# Patient Record
Sex: Male | Born: 1955 | Race: Black or African American | Hispanic: No | Marital: Married | Smoking: Never smoker
Health system: Southern US, Community
[De-identification: ages and names within clinical notes are randomized; demographics above are authoritative.]

## PROBLEM LIST (undated history)

## (undated) DIAGNOSIS — K219 Gastro-esophageal reflux disease without esophagitis: Secondary | ICD-10-CM

## (undated) DIAGNOSIS — E785 Hyperlipidemia, unspecified: Secondary | ICD-10-CM

## (undated) DIAGNOSIS — G4730 Sleep apnea, unspecified: Secondary | ICD-10-CM

## (undated) DIAGNOSIS — I10 Essential (primary) hypertension: Secondary | ICD-10-CM

## (undated) DIAGNOSIS — J45909 Unspecified asthma, uncomplicated: Secondary | ICD-10-CM

## (undated) DIAGNOSIS — M1990 Unspecified osteoarthritis, unspecified site: Secondary | ICD-10-CM

## (undated) DIAGNOSIS — E119 Type 2 diabetes mellitus without complications: Secondary | ICD-10-CM

## (undated) HISTORY — PX: HERNIA REPAIR: SHX51

## (undated) HISTORY — PX: WRIST FRACTURE SURGERY: SHX121

---

## 1999-10-22 ENCOUNTER — Emergency Department (HOSPITAL_COMMUNITY): Admission: EM | Admit: 1999-10-22 | Discharge: 1999-10-22 | Payer: Self-pay | Admitting: Emergency Medicine

## 2000-11-16 ENCOUNTER — Emergency Department (HOSPITAL_COMMUNITY): Admission: EM | Admit: 2000-11-16 | Discharge: 2000-11-16 | Payer: Self-pay | Admitting: Emergency Medicine

## 2001-03-19 ENCOUNTER — Emergency Department (HOSPITAL_COMMUNITY): Admission: EM | Admit: 2001-03-19 | Discharge: 2001-03-19 | Payer: Self-pay | Admitting: Emergency Medicine

## 2002-01-19 ENCOUNTER — Emergency Department (HOSPITAL_COMMUNITY): Admission: EM | Admit: 2002-01-19 | Discharge: 2002-01-19 | Payer: Self-pay

## 2002-06-10 ENCOUNTER — Emergency Department (HOSPITAL_COMMUNITY): Admission: EM | Admit: 2002-06-10 | Discharge: 2002-06-10 | Payer: Self-pay

## 2002-07-20 ENCOUNTER — Emergency Department (HOSPITAL_COMMUNITY): Admission: EM | Admit: 2002-07-20 | Discharge: 2002-07-20 | Payer: Self-pay | Admitting: Emergency Medicine

## 2002-07-20 ENCOUNTER — Encounter: Payer: Self-pay | Admitting: Emergency Medicine

## 2004-01-19 ENCOUNTER — Ambulatory Visit (HOSPITAL_COMMUNITY): Admission: RE | Admit: 2004-01-19 | Discharge: 2004-01-19 | Payer: Self-pay | Admitting: General Surgery

## 2005-04-28 ENCOUNTER — Ambulatory Visit: Payer: Self-pay | Admitting: Family Medicine

## 2005-07-04 ENCOUNTER — Ambulatory Visit: Payer: Self-pay | Admitting: Family Medicine

## 2005-08-28 ENCOUNTER — Encounter: Admission: RE | Admit: 2005-08-28 | Discharge: 2005-11-26 | Payer: Self-pay | Admitting: Family Medicine

## 2005-10-22 ENCOUNTER — Emergency Department (HOSPITAL_COMMUNITY): Admission: EM | Admit: 2005-10-22 | Discharge: 2005-10-22 | Payer: Self-pay | Admitting: Emergency Medicine

## 2005-11-27 ENCOUNTER — Encounter: Admission: RE | Admit: 2005-11-27 | Discharge: 2005-12-17 | Payer: Self-pay | Admitting: Family Medicine

## 2005-12-19 ENCOUNTER — Emergency Department (HOSPITAL_COMMUNITY): Admission: EM | Admit: 2005-12-19 | Discharge: 2005-12-19 | Payer: Self-pay | Admitting: Emergency Medicine

## 2006-01-02 ENCOUNTER — Ambulatory Visit: Payer: Self-pay | Admitting: Family Medicine

## 2006-08-05 ENCOUNTER — Ambulatory Visit (HOSPITAL_COMMUNITY): Admission: RE | Admit: 2006-08-05 | Discharge: 2006-08-05 | Payer: Self-pay | Admitting: Internal Medicine

## 2011-01-04 ENCOUNTER — Encounter: Payer: Self-pay | Admitting: Family Medicine

## 2014-01-05 ENCOUNTER — Emergency Department (HOSPITAL_COMMUNITY)
Admission: EM | Admit: 2014-01-05 | Discharge: 2014-01-05 | Disposition: A | Discharge status: Home / Self Care | Payer: 59 | Attending: Emergency Medicine | Admitting: Emergency Medicine

## 2014-01-05 ENCOUNTER — Encounter (HOSPITAL_COMMUNITY): Payer: Self-pay | Admitting: Emergency Medicine

## 2014-01-05 ENCOUNTER — Emergency Department (HOSPITAL_COMMUNITY): Payer: 59

## 2014-01-05 DIAGNOSIS — R109 Unspecified abdominal pain: Secondary | ICD-10-CM | POA: Insufficient documentation

## 2014-01-05 DIAGNOSIS — Z79899 Other long term (current) drug therapy: Secondary | ICD-10-CM | POA: Insufficient documentation

## 2014-01-05 DIAGNOSIS — Z7982 Long term (current) use of aspirin: Secondary | ICD-10-CM | POA: Insufficient documentation

## 2014-01-05 DIAGNOSIS — E119 Type 2 diabetes mellitus without complications: Secondary | ICD-10-CM | POA: Insufficient documentation

## 2014-01-05 HISTORY — DX: Type 2 diabetes mellitus without complications: E11.9

## 2014-01-05 LAB — URINALYSIS, ROUTINE W REFLEX MICROSCOPIC
Bilirubin Urine: NEGATIVE
Glucose, UA: NEGATIVE mg/dL
Hgb urine dipstick: NEGATIVE
KETONES UR: NEGATIVE mg/dL
Leukocytes, UA: NEGATIVE
NITRITE: NEGATIVE
Protein, ur: NEGATIVE mg/dL
Specific Gravity, Urine: 1.02 (ref 1.005–1.030)
Urobilinogen, UA: 0.2 mg/dL (ref 0.0–1.0)
pH: 6 (ref 5.0–8.0)

## 2014-01-05 MED ORDER — MORPHINE SULFATE 4 MG/ML IJ SOLN
4.0000 mg | Freq: Once | INTRAMUSCULAR | Status: AC
  Administered 2014-01-05: 4 mg via INTRAVENOUS
  Filled 2014-01-05: qty 1

## 2014-01-05 MED ORDER — KETOROLAC TROMETHAMINE 30 MG/ML IJ SOLN
30.0000 mg | Freq: Once | INTRAMUSCULAR | Status: AC
  Administered 2014-01-05: 30 mg via INTRAVENOUS
  Filled 2014-01-05: qty 1

## 2014-01-05 MED ORDER — PROMETHAZINE HCL 25 MG PO TABS: 25.0000 mg | ORAL_TABLET | Freq: Four times a day (QID) | ORAL | Status: DC | PRN

## 2014-01-05 MED ORDER — SODIUM CHLORIDE 0.9 % IV BOLUS (SEPSIS)
500.0000 mL | Freq: Once | INTRAVENOUS | Status: AC
  Administered 2014-01-05: 500 mL via INTRAVENOUS

## 2014-01-05 MED ORDER — OXYCODONE-ACETAMINOPHEN 5-325 MG PO TABS: 1.0000 | ORAL_TABLET | ORAL | Status: DC | PRN

## 2014-01-05 NOTE — ED Notes (Signed)
Pt c/o pain in left flank x 2 weeks.  Reports 2 weeks ago had some hematuria and burning with urination.  Denies n/v/d.

## 2014-01-05 NOTE — ED Provider Notes (Signed)
CSN: [id number]     Arrival date & time 01/05/14  1441 History   First MD Initiated Contact with Patient 01/05/14 1453     Chief Complaint  Patient presents with  . Flank Pain   (Consider location/radiation/quality/duration/timing/severity/associated sxs/prior Treatment) HPI.... Sharp left flank pain for 2 weeks with minimal hematuria. No hematuria now. Questionable dysuria. No fever or chills. No prior history of kidney stones. No radiation of pain. Nothing makes symptoms better or worse. Severity is moderate.  Past Medical History  Diagnosis Date  . Diabetes mellitus without complication    History reviewed. No pertinent past surgical history. No family history on file. History  Substance Use Topics  . Smoking status: Never Smoker   . Smokeless tobacco: Not on file  . Alcohol Use: No    Review of Systems  All other systems reviewed and are negative.    Allergies  Review of patient's allergies indicates no known allergies.  Home Medications   Current Outpatient Rx  Name  Route  Sig  Dispense  Refill  . aspirin EC 81 MG tablet   Oral   Take 81 mg by mouth daily.         . metFORMIN (GLUCOPHAGE) 500 MG tablet   Oral   Take 500 mg by mouth 3 (three) times daily after meals.         Marland Kitchen oxyCODONE-acetaminophen (PERCOCET) 5-325 MG per tablet   Oral   Take 1 tablet by mouth every 4 (four) hours as needed.   15 tablet   0   . promethazine (PHENERGAN) 25 MG tablet   Oral   Take 1 tablet (25 mg total) by mouth every 6 (six) hours as needed for nausea or vomiting.   15 tablet   0    BP 149/83  Pulse 65  Temp(Src) 98.5 F (36.9 C) (Oral)  Resp 20  Ht 5\' 6"  (1.676 m)  Wt 238 lb (107.956 kg)  BMI 38.43 kg/m2  SpO2 95% Physical Exam  Nursing note and vitals reviewed. Constitutional: He is oriented to person, place, and time. He appears well-developed and well-nourished.  HENT:  Head: Normocephalic and atraumatic.  Eyes: Conjunctivae and EOM are normal.  Pupils are equal, round, and reactive to light.  Neck: Normal range of motion. Neck supple.  Cardiovascular: Normal rate, regular rhythm and normal heart sounds.   Pulmonary/Chest: Effort normal and breath sounds normal.  Abdominal: Soft. Bowel sounds are normal.  Genitourinary:  Minimal left flank tenderness.  Musculoskeletal: Normal range of motion.  Neurological: He is alert and oriented to person, place, and time.  Skin: Skin is warm and dry.  Psychiatric: He has a normal mood and affect. His behavior is normal.    ED Course  Procedures (including critical care time) Labs Review Labs Reviewed  URINALYSIS, ROUTINE W REFLEX MICROSCOPIC   Imaging Review Ct Abdomen Pelvis Wo Contrast  01/05/2014   CLINICAL DATA:  Left flank pain. History of hernia repair. Diabetic.  EXAM: CT ABDOMEN AND PELVIS WITHOUT CONTRAST  TECHNIQUE: Multidetector CT imaging of the abdomen and pelvis was performed following the standard protocol without intravenous contrast.  COMPARISON:  None.  FINDINGS: No renal or ureteral obstructing stone or findings to suggest renal collecting system obstruction.  Taking into account limitation by non contrast imaging, no worrisome splenic, pancreatic, renal or adrenal lesion.  Enlarged liver spanning over 20 cm. 1 cm low-density structure dome of the right lobe of liver too small to characterize. No calcified gallstones.  No extra luminal bowel inflammatory process, free fluid or free air.  No abdominal aortic aneurysm.  Non contrast filled imaging of the urinary bladder unremarkable. Small prostate gland calcifications.  Degenerative changes lumbar spine most notable involving the L4-5 facet joint bilaterally. Right hip joint degenerative changes.  Left external iliac slightly prominent size lymph node with short axis dimension 1.1 cm. Etiology/ significance indeterminate.  Fatty containing right inguinal hernia.  Lung bases with minimal scarring/atelectasis.  IMPRESSION: No renal  or ureteral obstructing stone or findings to suggest renal collecting system obstruction.  Enlarged liver spanning over 20 cm. 1 cm low-density structure dome of the right lobe of liver too small to characterize.  No extra luminal bowel inflammatory process, free fluid or free air.  No abdominal aortic aneurysm.  Degenerative changes lumbar spine most notable involving the L4-5 facet joint bilaterally. Right hip joint degenerative changes.  Left external iliac slightly prominent size lymph node with short axis dimension 1.1 cm. Etiology/ significance indeterminate.  Fatty containing right inguinal hernia.   Electronically Signed   By: Chauncey Cruel M.D.   On: 01/05/2014 16:55    EKG Interpretation   None       MDM   1. Left flank pain    No acute abdomen. Urinalysis was normal. CT scan showed no acute findings.  Discharge medications Percocet and Phenergan 25 mg    Nat Christen, MD 01/05/14 2039

## 2014-01-05 NOTE — Discharge Instructions (Signed)
CT scan showed no obvious kidney stone. Medication for pain and nausea. Return if worse. Can also take ibuprofen 4 tablets 3 times a day

## 2014-05-17 ENCOUNTER — Other Ambulatory Visit (HOSPITAL_COMMUNITY): Payer: Self-pay | Admitting: Urology

## 2014-05-17 DIAGNOSIS — R109 Unspecified abdominal pain: Secondary | ICD-10-CM

## 2014-05-23 ENCOUNTER — Ambulatory Visit (HOSPITAL_COMMUNITY)
Admission: RE | Admit: 2014-05-23 | Discharge: 2014-05-23 | Disposition: A | Discharge status: Home / Self Care | Payer: 59 | Source: Ambulatory Visit | Attending: Urology | Admitting: Urology

## 2014-05-23 ENCOUNTER — Encounter (HOSPITAL_COMMUNITY): Payer: Self-pay

## 2014-05-23 DIAGNOSIS — K7689 Other specified diseases of liver: Secondary | ICD-10-CM | POA: Insufficient documentation

## 2014-05-23 DIAGNOSIS — R1032 Left lower quadrant pain: Secondary | ICD-10-CM | POA: Insufficient documentation

## 2014-05-23 DIAGNOSIS — K4090 Unilateral inguinal hernia, without obstruction or gangrene, not specified as recurrent: Secondary | ICD-10-CM | POA: Insufficient documentation

## 2014-05-23 DIAGNOSIS — R109 Unspecified abdominal pain: Secondary | ICD-10-CM

## 2014-05-31 ENCOUNTER — Other Ambulatory Visit (HOSPITAL_COMMUNITY): Payer: Self-pay | Admitting: Urology

## 2014-05-31 DIAGNOSIS — R310 Gross hematuria: Secondary | ICD-10-CM

## 2014-06-06 ENCOUNTER — Ambulatory Visit (HOSPITAL_COMMUNITY): Payer: 59

## 2014-06-13 ENCOUNTER — Encounter (HOSPITAL_COMMUNITY): Payer: Self-pay

## 2014-06-13 ENCOUNTER — Ambulatory Visit (HOSPITAL_COMMUNITY)
Admission: RE | Admit: 2014-06-13 | Discharge: 2014-06-13 | Disposition: A | Discharge status: Home / Self Care | Payer: 59 | Source: Ambulatory Visit | Attending: Urology | Admitting: Urology

## 2014-06-13 DIAGNOSIS — R1032 Left lower quadrant pain: Secondary | ICD-10-CM | POA: Insufficient documentation

## 2014-06-13 DIAGNOSIS — R310 Gross hematuria: Secondary | ICD-10-CM

## 2014-06-13 DIAGNOSIS — K7689 Other specified diseases of liver: Secondary | ICD-10-CM | POA: Insufficient documentation

## 2014-06-13 DIAGNOSIS — K4090 Unilateral inguinal hernia, without obstruction or gangrene, not specified as recurrent: Secondary | ICD-10-CM | POA: Insufficient documentation

## 2014-06-13 LAB — POCT I-STAT CREATININE: Creatinine, Ser: 0.9 mg/dL (ref 0.50–1.35)

## 2014-06-13 MED ORDER — SODIUM CHLORIDE 0.9 % IV SOLN
INTRAVENOUS | Status: AC
  Filled 2014-06-13: qty 250

## 2014-06-13 MED ORDER — IOHEXOL 300 MG/ML  SOLN
125.0000 mL | Freq: Once | INTRAMUSCULAR | Status: AC | PRN
  Administered 2014-06-13: 125 mL via INTRAVENOUS

## 2015-06-22 ENCOUNTER — Encounter (HOSPITAL_COMMUNITY): Payer: Self-pay

## 2015-06-22 ENCOUNTER — Emergency Department (HOSPITAL_COMMUNITY)
Admission: EM | Admit: 2015-06-22 | Discharge: 2015-06-22 | Disposition: A | Discharge status: Home / Self Care | Payer: 59 | Attending: Emergency Medicine | Admitting: Emergency Medicine

## 2015-06-22 DIAGNOSIS — Z7982 Long term (current) use of aspirin: Secondary | ICD-10-CM | POA: Insufficient documentation

## 2015-06-22 DIAGNOSIS — M791 Myalgia: Secondary | ICD-10-CM | POA: Insufficient documentation

## 2015-06-22 DIAGNOSIS — E119 Type 2 diabetes mellitus without complications: Secondary | ICD-10-CM | POA: Insufficient documentation

## 2015-06-22 DIAGNOSIS — R252 Cramp and spasm: Secondary | ICD-10-CM | POA: Insufficient documentation

## 2015-06-22 LAB — I-STAT CHEM 8, ED
BUN: 18 mg/dL (ref 6–20)
Calcium, Ion: 1.14 mmol/L (ref 1.13–1.30)
Chloride: 101 mmol/L (ref 101–111)
Creatinine, Ser: 1.6 mg/dL — ABNORMAL HIGH (ref 0.61–1.24)
Glucose, Bld: 109 mg/dL — ABNORMAL HIGH (ref 65–99)
HCT: 43 % (ref 39.0–52.0)
HEMOGLOBIN: 14.6 g/dL (ref 13.0–17.0)
POTASSIUM: 3.5 mmol/L (ref 3.5–5.1)
SODIUM: 139 mmol/L (ref 135–145)
TCO2: 26 mmol/L (ref 0–100)

## 2015-06-22 LAB — I-STAT TROPONIN, ED: Troponin i, poc: 0 ng/mL (ref 0.00–0.08)

## 2015-06-22 NOTE — ED Notes (Signed)
Pt in by ems from work, c/o left arm tingling, denies chest pain.  Pt told ems it has eased off now.

## 2015-06-22 NOTE — ED Notes (Signed)
Discharge instructions given, pt demonstrated teach back and verbal understanding. No concerns voiced.  

## 2015-06-22 NOTE — Discharge Instructions (Signed)

## 2015-06-22 NOTE — ED Provider Notes (Signed)
CSN: [id number]     Arrival date & time 06/22/15  2009 History  This chart was scribed for Pattricia Boss, MD by Randa Evens, ED Scribe. This patient was seen in room [facility]07/[facility] and the patient's care was started at 8:22 PM.      Chief Complaint  Patient presents with  . Arm tingling    The history is provided by the patient. No language interpreter was used.   HPI Comments: Peter Lewis is a 59 y.o. male brought in by ambulance, who presents to the Emergency Department complaining of new left hand cramping and pain onset today that lasted for about 10-20 minutes. Pt states that he had associated tingling in the left forearm. Pt does report some resolved SOB. 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 Pt denies any medications PTA. Denies weakness, CP, visual changes or speech difficulty.     Past Medical History  Diagnosis Date  . Diabetes mellitus without complication    History reviewed. No pertinent past surgical history. No family history on file. History  Substance Use Topics  . Smoking status: Never Smoker   . Smokeless tobacco: Not on file  . Alcohol Use: Yes    Review of Systems  Eyes: Negative for visual disturbance.  Cardiovascular: Negative for chest pain.  Musculoskeletal: Positive for myalgias.  Neurological: Negative for speech difficulty and weakness.  All other systems reviewed and are negative.     Allergies  Review of patient's allergies indicates no known allergies.  Home Medications   Prior to Admission medications   Medication Sig Start Date End Date Taking? Authorizing Provider  aspirin EC 81 MG tablet Take 81 mg by mouth daily.    Historical Provider, MD  metFORMIN (GLUCOPHAGE) 500 MG tablet Take 500 mg by mouth 3 (three) times daily after meals.    Historical Provider, MD  oxyCODONE-acetaminophen (PERCOCET) 5-325 MG per tablet Take 1 tablet by mouth every 4 (four) hours as needed. 01/05/14    Nat Christen, MD  promethazine (PHENERGAN) 25 MG tablet Take 1 tablet (25 mg total) by mouth every 6 (six) hours as needed for nausea or vomiting. 01/05/14   Nat Christen, MD   BP 141/87 mmHg  Pulse 63  Temp(Src) 98.3 F (36.8 C) (Oral)  Resp 18  Ht 5\' 6"  (1.676 m)  Wt 234 lb (106.142 kg)  BMI 37.79 kg/m2  SpO2 97%   Physical Exam  Constitutional: He is oriented to person, place, and time. He appears well-developed and well-nourished. No distress.  HENT:  Head: Normocephalic and atraumatic.  Right Ear: Tympanic membrane and external ear normal.  Left Ear: Tympanic membrane and external ear normal.  Nose: Nose normal. Right sinus exhibits no maxillary sinus tenderness and no frontal sinus tenderness. Left sinus exhibits no maxillary sinus tenderness and no frontal sinus tenderness.  Eyes: Conjunctivae and EOM are normal. Pupils are equal, round, and reactive to light. Right eye exhibits no nystagmus. Left eye exhibits no nystagmus.  Neck: Normal range of motion. Neck supple. No tracheal deviation present.  Cardiovascular: Normal rate, regular rhythm, normal heart sounds and intact distal pulses.   Pulmonary/Chest: Effort normal and breath sounds normal. No respiratory distress. He exhibits no tenderness.  Abdominal: Soft. Bowel sounds are normal. He exhibits no distension and no mass. There is no tenderness.  Musculoskeletal: Normal range of motion. He exhibits no edema or tenderness.  Neurological: He is alert and oriented to  person, place, and time. He has normal strength and normal reflexes. No sensory deficit. He displays a negative Romberg sign. GCS eye subscore is 4. GCS verbal subscore is 5. GCS motor subscore is 6.  Reflex Scores:      Tricep reflexes are 2+ on the right side and 2+ on the left side.      Bicep reflexes are 2+ on the right side and 2+ on the left side.      Brachioradialis reflexes are 2+ on the right side and 2+ on the left side.      Patellar reflexes are 2+ on the  right side and 2+ on the left side.      Achilles reflexes are 2+ on the right side and 2+ on the left side. Patient with normal gait without ataxia, shuffling, spasm, or antalgia. Speech is normal without dysarthria, dysphasia, or aphasia. Muscle strength is 5/5 in bilateral shoulders, elbow flexor and extensors, wrist flexor and extensors, and intrinsic hand muscles. 5/5 bilateral lower extremity hip flexors, extensors, knee flexors and extensors, and ankle dorsi and plantar flexors.    Skin: Skin is warm and dry. No rash noted.  Psychiatric: He has a normal mood and affect. His behavior is normal. Judgment and thought content normal.  Nursing note and vitals reviewed.   ED Course  Procedures (including critical care time) DIAGNOSTIC STUDIES: Oxygen Saturation is 97% on RA, normal by my interpretation.    COORDINATION OF CARE: 8:27 PM-Discussed treatment plan with pt at bedside and pt agreed to plan.     Labs Review Labs Reviewed  I-STAT CHEM 8, ED - Abnormal; Notable for the following:    Creatinine, Ser 1.60 (*)    Glucose, Bld 109 (*)    All other components within normal limits  I-STAT TROPOININ, ED    Imaging Review No results found.   EKG Interpretation   Date/Time:  Friday June 22 2015 20:17:42 EDT Ventricular Rate:  56 PR Interval:  162 QRS Duration: 94 QT Interval:  426 QTC Calculation: 411 R Axis:   38 Text Interpretation:  Sinus rhythm Borderline T wave abnormalities  Confirmed by Constantin Hillery MD, Andee Poles [phone]) on 06/22/2015 8:27:59 PM      MDM   Final diagnoses:  Hand cramp    59 year old male who had an episode of painful left hand cramping which resolved prior to my valuation. He reported some left arm tingling at that time which sounds more like radiation cramping. He denied any other symptoms that could've been construed as neurological or associated with left chest pain. He denies any chest pain although he states he had a short amount of dyspnea while  this occurred. EKG shows no evidence of ischemia electrolytes are normal. Patient is cautioned regarding need for return if he has recurrence of symptoms otherwise he is to call his primary care physician next week. I personally performed the services described in this documentation, which was scribed in my presence. The recorded information has been reviewed and considered.   Pattricia Boss, MD 06/23/15 Vernelle Emerald

## 2015-07-22 ENCOUNTER — Emergency Department (HOSPITAL_COMMUNITY)
Admission: EM | Admit: 2015-07-22 | Discharge: 2015-07-22 | Disposition: A | Discharge status: Home / Self Care | Payer: 59 | Attending: Emergency Medicine | Admitting: Emergency Medicine

## 2015-07-22 ENCOUNTER — Encounter (HOSPITAL_COMMUNITY): Payer: Self-pay

## 2015-07-22 DIAGNOSIS — Z7982 Long term (current) use of aspirin: Secondary | ICD-10-CM | POA: Diagnosis not present

## 2015-07-22 DIAGNOSIS — K0889 Other specified disorders of teeth and supporting structures: Secondary | ICD-10-CM

## 2015-07-22 DIAGNOSIS — K088 Other specified disorders of teeth and supporting structures: Secondary | ICD-10-CM | POA: Diagnosis present

## 2015-07-22 DIAGNOSIS — Z79899 Other long term (current) drug therapy: Secondary | ICD-10-CM | POA: Insufficient documentation

## 2015-07-22 DIAGNOSIS — E119 Type 2 diabetes mellitus without complications: Secondary | ICD-10-CM | POA: Diagnosis not present

## 2015-07-22 LAB — CBG MONITORING, ED: Glucose-Capillary: 111 mg/dL — ABNORMAL HIGH (ref 65–99)

## 2015-07-22 MED ORDER — PENICILLIN V POTASSIUM 500 MG PO TABS: 500.0000 mg | ORAL_TABLET | Freq: Four times a day (QID) | ORAL | Status: AC

## 2015-07-22 MED ORDER — OXYCODONE-ACETAMINOPHEN 5-325 MG PO TABS: 2.0000 | ORAL_TABLET | ORAL | Status: DC | PRN

## 2015-07-22 MED ORDER — OXYCODONE-ACETAMINOPHEN 5-325 MG PO TABS: 1.0000 | ORAL_TABLET | ORAL | Status: DC | PRN

## 2015-07-22 NOTE — Discharge Instructions (Signed)
Dental Pain °Toothache is pain in or around a tooth. It may get worse with chewing or with cold or heat.  °HOME CARE °· Your dentist may use a numbing medicine during treatment. If so, you may need to avoid eating until the medicine wears off. Ask your dentist about this. °· Only take medicine as told by your dentist or doctor. °· Avoid chewing food near the painful tooth until after all treatment is done. Ask your dentist about this. °GET HELP RIGHT AWAY IF:  °· The problem gets worse or new problems appear. °· You have a fever. °· There is redness and puffiness (swelling) of the face, jaw, or neck. °· You cannot open your mouth. °· There is pain in the jaw. °· There is very bad pain that is not helped by medicine. °MAKE SURE YOU:  °· Understand these instructions. °· Will watch your condition. °· Will get help right away if you are not doing well or get worse. °Document Released: 05/19/2008 Document Revised: 02/23/2012 Document Reviewed: 05/19/2008 °ExitCare® Patient Information ©2015 [employer]. This information is not intended to replace advice given to you by your health care provider. Make sure you discuss any questions you have with your health care provider. ° ° ° °Emergency Department Resource Guide °1) Find a Doctor and Pay Out of Pocket °Although you won't have to find out who is covered by your insurance plan, it is a good idea to ask around and get recommendations. You will then need to call the office and see if the doctor you have chosen will accept you as a new patient and what types of options they offer for patients who are self-pay. Some doctors offer discounts or will set up payment plans for their patients who do not have insurance, but you will need to ask so you aren't surprised when you get to your appointment. ° °2) Contact Your Local Health Department °Not all health departments have doctors that can see patients for sick visits, but many do, so it is worth a call to see if yours does.  If you don't know where your local health department is, you can check in your phone book. The CDC also has a tool to help you locate your state's health department, and many state websites also have listings of all of their local health departments. ° °3) Find a Walk-in Clinic °If your illness is not likely to be very severe or complicated, you may want to try a walk in clinic. These are popping up all over the country in pharmacies, drugstores, and shopping centers. They're usually staffed by nurse practitioners or physician assistants that have been trained to treat common illnesses and complaints. They're usually fairly quick and inexpensive. However, if you have serious medical issues or chronic medical problems, these are probably not your best option. ° °No Primary Care Doctor: °- Call [facility] at  832-8000 - they can help you locate a primary care doctor that  accepts your insurance, provides certain services, etc. °- Physician Referral Service- 1-800-533-3463 ° °Chronic Pain Problems: °Organization         Address  Phone   Notes  °[facility]  (336) 297-2271 Patients need to be referred by their primary care doctor.  ° °Medication Assistance: °Organization         Address  Phone   Notes  °[place] [facility] [address] °[place], [place] [address] (336) 641-8030 --Must be a resident   of [place] °-- Must have NO insurance coverage whatsoever (no Medicaid/ Medicare, etc.) °-- The pt. MUST have a primary care doctor that directs their care regularly and follows them in the community °  °[facility]  (866) 331-1348   °[employer]  (888) 892-1162   ° °Agencies that provide inexpensive medical care: °Organization         Address  Phone   Notes  °[facility]  (336) 832-8035   °[facility]    (336) 832-7272   °[facility] [address] °[place], [place] [address] (336) 832-4777   °[facility] [address], °[place] (336) 271-4999   °[facility]    (336) 373-0678   °[facility]    (336) 272-1050   °[facility] ° [address], [place] Phone:  (336) 832-4444, Fax:  (336) 832-4440 Hours of Operation:  9 am - 6 pm, M-F.  Also accepts Medicaid/Medicare and self-pay.  °[facility] ° [address], [place] Phone: (336) 832-3150, Fax: (336) 832-3151. Hours of Operation:  8:30 am - 5:30 pm, M-F.  Also accepts Medicaid and self-pay.  °[facility] [place] [address], [place] Phone: (336) 878-6027   °[facility] [address], [place], [place] (336)723-1848, Ext. 123 Mondays & Thursdays: 7-9 AM.  First 15 patients are seen on a first come, first serve basis. °  ° °Medicaid-accepting [place] Providers: ° °Organization         Address  Phone   Notes  °[facility] [address], [place] (336) 641-2100 Also accepts self-pay patients.  °[facility] [address], [place] ° (336) 856-9996   °[facility] [address] [place] [address], [place] (336) 288-8857   °[facility] [address] [place] [address], [place] (336) 299-7000   °Veita Bland [address], [place]  ° (336) 373-1557 Only accepts [place] Access Medicaid patients after they have their name applied to their card.  ° °Self-Pay (no insurance) in [place]: ° °Organization         Address  Phone   Notes  °Sickle Cell Patients, [facility] [address], [place] (336) 832-1970   °[facility] [address], [place] (336) 832-4400   °[facility] [place] ° [address], [place] (336) 992-4800   °[facility]/Dr. Osei-Bonsu ° [address] [place] [address], [place] or [address], [place] (336) 841-8500  Phone number for both [place] and [place] locations is the same.  °[facility] [address], [place] (336) 299-0000   °[facility] [address] [place] [address], [place] or [address] (336) 852-7530 °(336) 878-2260   °[facility] [address],  (336) 350-1642, phone; (336) 294-5005, fax Sees patients 1st and 3rd Saturday of every month.  Must not qualify for public or private insurance (i.e. Medicaid, Medicare, [place] Health Choice, Veterans' Benefits) • Household income should be no more than 200% of the poverty level •The clinic cannot treat you if you are pregnant or think you are pregnant • Sexually transmitted diseases are not treated at the clinic.  ° ° °Dental Care: °Organization         Address    Phone  Notes  °[facility] [facility] [address], [place] (336) 641-6152 Accepts children up to age 21 who are enrolled in Medicaid or [place] Health Choice; pregnant women with a Medicaid card; and children who have applied for Medicaid or [place] Health Choice, but were declined, whose parents can pay a reduced fee at time of service.  °[facility] [place]  [address], [place] (336) 641-7733 Accepts children up to age 21 who are enrolled in Medicaid or [place] Health Choice; pregnant women with a Medicaid card; and children who have applied for Medicaid or [place] Health Choice, but were declined, whose parents can pay a reduced fee at time of service.  °[facility] ° [address], [place] (336) 641-4533 Patients are seen by appointment only. Walk-ins are not accepted. [facility] will see patients 18 years of age and older. °Monday - Tuesday (8am-5pm) °Most Wednesdays (8:30-5pm) °$30 per visit, cash only  °[facility] ° [address], [place] (336) 641-4533 Patients are seen by appointment  only. Walk-ins are not accepted. [facility] will see patients 18 years of age and older. °One Wednesday Evening (Monthly: Volunteer Based).  $30 per visit, cash only  °[facility]  (919) 537-3737 for adults; Children under age 4, call Graduate Pediatric Dentistry at (919) [phone]. Children aged 4-14, please call (919) [phone] to request a pediatric application. ° Dental services are provided in all areas of dental care including fillings, crowns and bridges, complete and partial dentures, implants, gum treatment, root canals, and extractions. Preventive care is also provided. Treatment is provided to both adults and children. °Patients are selected via a lottery and there is often a waiting list. °  °[facility] [address], °[place] ° (336) 763-8833 [email or web address] °  °[facility] [address], [place], [place] (336)723-1848, Ext. 123 Second and Fourth Thursday of each month, opens at 6:30 AM; Clinic ends at 9 AM.  Patients are seen on a first-come first-served basis, and a limited number are seen during each clinic.  ° °[facility] ° [address], [place], [place] (336) 723-7904   Eligibility Requirements °You must have lived in [place], [place], or [place] counties for at least the last three months. °  You cannot be eligible for state or federal sponsored healthcare insurance, including Veterans Administration, Medicaid, or Medicare. °  You generally cannot be eligible for healthcare insurance through your employer.  °  How to apply: °Eligibility screenings are held every Tuesday and Wednesday afternoon from 1:00 pm until 4:00 pm. You do not need an appointment for the interview!  °[facility] [address], [place], [place] 336-631-2330   °[facility]  336-342-8273   °[facility]  336-703-3100   °[facility]  336-570-6415   ° °Behavioral Health  Resources in the Community: °Intensive Outpatient Programs °Organization         Address  Phone  Notes  °[facility] [address], [place], [place] 336-878-6098   °[facility] [address], [place], [place] 336-832-9800   °[facility] [address], [place], [place] ° 336-882-2125   °[facility] [address],  °[place], [place] 1-800-853-5163 or 336-641-4981   °Substance Abuse Resources °Organization           Address  Phone  Notes  °[facility]  336-882-2125   °[facility]  336-784-9470   °[facility]  336-285-9073   °[facility]  336-845-3988   °Residential & Outpatient Substance Abuse Program  1-800-659-3381   °Psychological Services °Organization         Address  Phone  Notes  °[facility]  336- 832-9600   °[facility]  336- 378-7881   °[facility] [address], [place] 1-800-853-5163 or 336-641-4981   ° °Mobile Crisis Teams °Organization         Address  Phone  Notes  °[facility], Mobile Crisis Care Unit  1-877-626-1772   °Assertive °[facility] ° [address] [place], [place] 336-834-9664   °Sharon DeEsch [address] °[place] 336-554-5454   ° °Self-Help/Support Groups °Organization         Address  Phone             Notes  °[facility] - variety of support groups  336- 373-1402 Call for more information  °Narcotics Anonymous (NA), [facility] [address], °[place]  2 meetings at this location  ° °Residential Treatment Programs °Organization         Address  Phone  Notes  °[facility] [address],    °[place]  1-866-801-8205   °[facility] ° [address], [place], [place] 704-293-8524   °[facility] [address], [place] 336-845-3988 Admissions: 8am-3pm M-F  °[facility] [address]    °[place], [place] 336-841-1104   °[facility] [address], [place], [place] 336-379-7146   °[facility] [address]  °[place], [place] 336-285-9073   °[facility] Intensive Outpatient [address], [place], [place] 336-852-3033   °[facility] [address]  °[place], [place] 1-877-615-2722 or 336-784-9470   °[facility] [address] [place], [place] 336-227-7417 Accepts Medicaid  °[facility] [address]  ° [place] 1-800-659-3381 Substance Abuse/Addiction Treatment  ° °[place] Behavior[facility] Health Resources °Organization         Address  Phone  Notes  °[facility]  (888) 581-9988   °Julie Brannon, PhD [address] [place], [place]   (336) 349-5553 or (336) 951-0000   °[facility]   [address] °[place], [place] (336) 349-4454   °[facility] [address], [place], [place] (336) 342-8316 Insurance/Medicaid/sponsorship through [facility]  °[facility] [address]                                    [place], [place] (336) 342-8316 Therapy/tele-psych/case  °[facility] [address]  ° [place], [place] (336) 349-2233    °Dr. Arfeen  (336) 349-4544   °[facility] [place]  [employer] [place] [facility] 1) [address], [place] °2) [address], [place] °3)  [address], [place] (336) 349-3220 °(336) 342-7768 ° °(336) 342-8140   °[place] [facility] (336) 342-1394 or (336) 342-3537 (After Hours)    ° °  °

## 2015-07-22 NOTE — ED Notes (Signed)
Patient verbalizes understanding of discharge instructions, prescription medications, home care and follow up care. Patient ambulatory out of department at this time. 

## 2015-07-22 NOTE — ED Notes (Signed)
Right lower tooth ache X2 days

## 2015-07-24 MED FILL — Oxycodone w/ Acetaminophen Tab 5-325 MG: ORAL | Qty: 6 | Status: AC

## 2015-07-29 NOTE — ED Provider Notes (Signed)
CSN: [id number]     Arrival date & time 07/22/15  0424 History   First MD Initiated Contact with Patient 07/22/15 [phone]     Chief Complaint  Patient presents with  . Dental Pain     (Consider location/radiation/quality/duration/timing/severity/associated sxs/prior Treatment) HPI   59 year old male with toothache. Right lower molar. Denies any trauma. Gradual onset about 2 days ago. Progressively worsening. Within the past day has had some mild right facial swelling. No fevers or chills. No drainage. No neck pain. No difficulty breathing or swallowing. Has been taking NSAIDs with only mild relief.  Past Medical History  Diagnosis Date  . Diabetes mellitus without complication    History reviewed. No pertinent past surgical history. History reviewed. No pertinent family history. Social History  Substance Use Topics  . Smoking status: Never Smoker   . Smokeless tobacco: None  . Alcohol Use: Yes    Review of Systems  All systems reviewed and negative, other than as noted in HPI.   Allergies  Review of patient's allergies indicates no known allergies.  Home Medications   Prior to Admission medications   Medication Sig Start Date End Date Taking? Authorizing Provider  aspirin EC 81 MG tablet Take 81 mg by mouth daily.   Yes Historical Provider, MD  metFORMIN (GLUCOPHAGE) 500 MG tablet Take 500 mg by mouth 3 (three) times daily after meals.   Yes Historical Provider, MD  oxyCODONE-acetaminophen (PERCOCET) 5-325 MG per tablet Take 1 tablet by mouth every 4 (four) hours as needed. 01/05/14   Nat Christen, MD  oxyCODONE-acetaminophen (PERCOCET/ROXICET) 5-325 MG per tablet Take 2 tablets by mouth every 4 (four) hours as needed for severe pain. 07/22/15   Virgel Manifold, MD  oxyCODONE-acetaminophen (PERCOCET/ROXICET) 5-325 MG per tablet Take 1-2 tablets by mouth every 4 (four) hours as needed. 07/22/15   Virgel Manifold, MD  penicillin v potassium (VEETID) 500 MG tablet Take 1 tablet (500 mg  total) by mouth 4 (four) times daily. 07/22/15 07/29/15  Virgel Manifold, MD  promethazine (PHENERGAN) 25 MG tablet Take 1 tablet (25 mg total) by mouth every 6 (six) hours as needed for nausea or vomiting. 01/05/14   Nat Christen, MD   BP 170/85 mmHg  Pulse 67  Temp(Src) 98.4 F (36.9 C) (Oral)  Resp 18  Ht 5\' 6"  (1.676 m)  Wt 230 lb (104.327 kg)  BMI 37.14 kg/m2  SpO2 97% Physical Exam  Constitutional: He appears well-developed and well-nourished. No distress.  HENT:  Head: Normocephalic and atraumatic.  Mild right facial swelling. Dental caries right lower molar. No identifiable drainable collection. Oropharynx is otherwise clear. Uvula is midline. Handling secretions. Normal sounding voice. Neck is supple. No nodes.  Eyes: Conjunctivae are normal. Right eye exhibits no discharge. Left eye exhibits no discharge.  Neck: Neck supple.  Cardiovascular: Normal rate, regular rhythm and normal heart sounds.  Exam reveals no gallop and no friction rub.   No murmur heard. Pulmonary/Chest: Effort normal and breath sounds normal. No respiratory distress.  Abdominal: Soft. He exhibits no distension. There is no tenderness.  Musculoskeletal: He exhibits no edema or tenderness.  Neurological: He is alert.  Skin: Skin is warm and dry.  Psychiatric: He has a normal mood and affect. His behavior is normal. Thought content normal.  Nursing note and vitals reviewed.   ED Course  Procedures (including critical care time) Labs Review Labs Reviewed  CBG MONITORING, ED - Abnormal; Notable for the following:    Glucose-Capillary 111 (*)  All other components within normal limits    Imaging Review No results found. I, Willene Holian, personally reviewed and evaluated these images and lab results as part of my medical decision-making.   EKG Interpretation None      MDM   Final diagnoses:  Pain, dental    [age]-year-old male with dental pain. No evidence of serious deep space head/neck  infection. Plan antibiotics and symptomatic treatment of pain. Discussed the need for definitive dental follow-up.    Virgel Manifold, MD 07/29/15 [phone]

## 2016-01-06 IMAGING — CT CT ABD-PELV W/O CM
2 of 3 series · 17 of 46 positions shown, 19 images · non-contrast
Comparison: Abdominal pelvic CT 01/05/2014.

CLINICAL DATA: Left flank pain.

EXAM:
CT ABDOMEN AND PELVIS WITHOUT CONTRAST
TECHNIQUE: Multidetector CT imaging of the abdomen and pelvis was performed
following the standard protocol without IV contrast.

[Series 2: standard/full over (age)lbs 5.0 · axial · 0.87mm/px · z∈[-444,-34]mm · 14 of 96 slices shown, 16 images]
[im 7/96  soft-tissue]
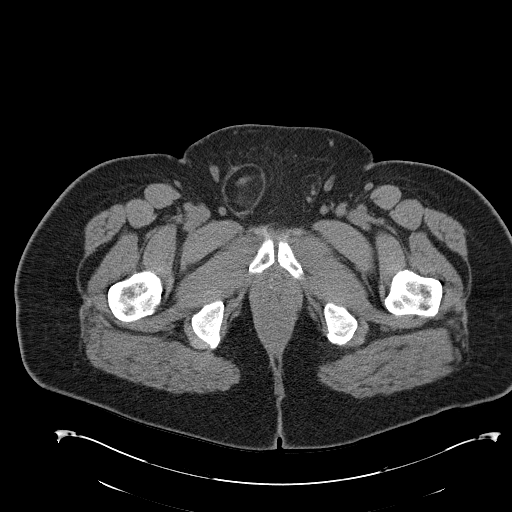
[im 7/96  bone]
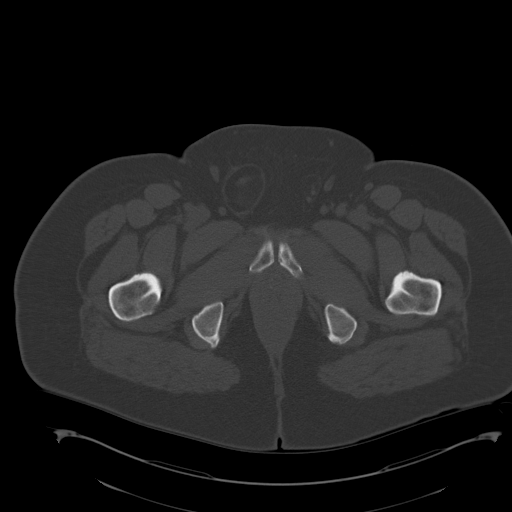
[im 13/96  soft-tissue]
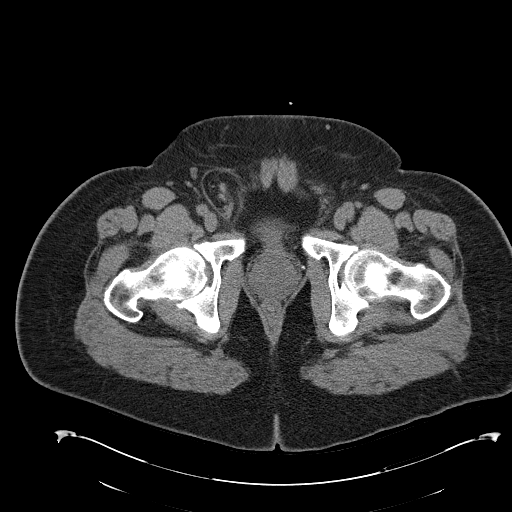
[im 19/96  soft-tissue]
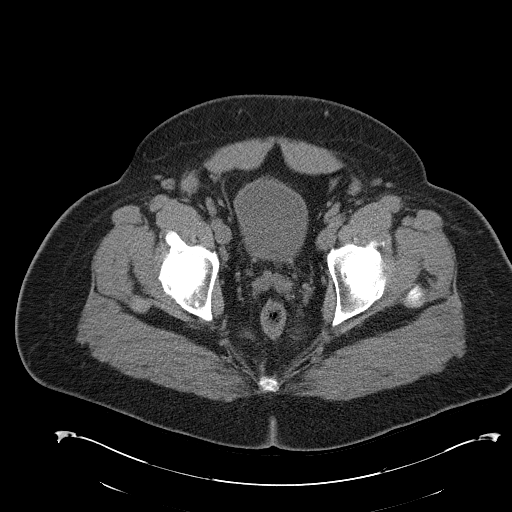
[im 25/96  soft-tissue]
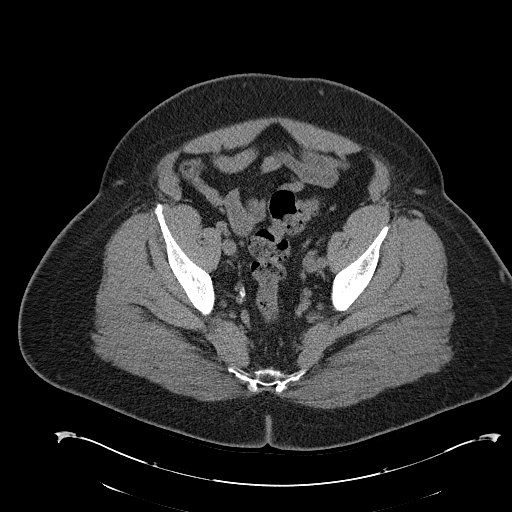
[im 31/96  soft-tissue]
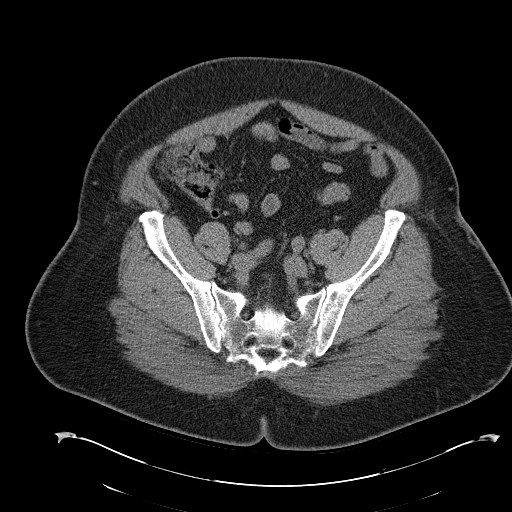
[im 37/96  soft-tissue]
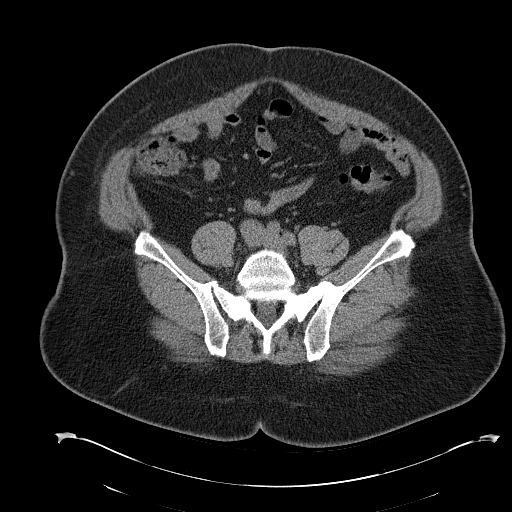
[im 43/96  soft-tissue]
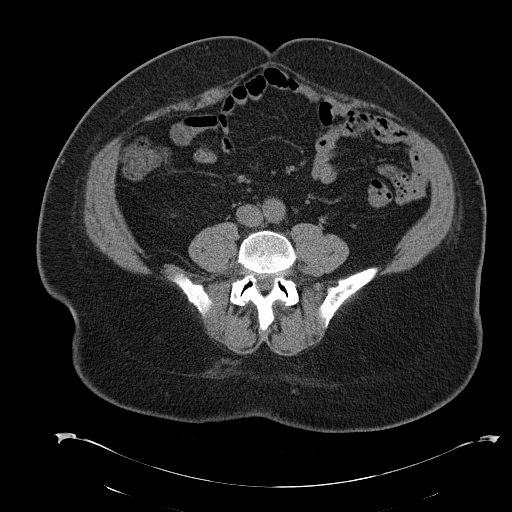
[im 53/96  soft-tissue]
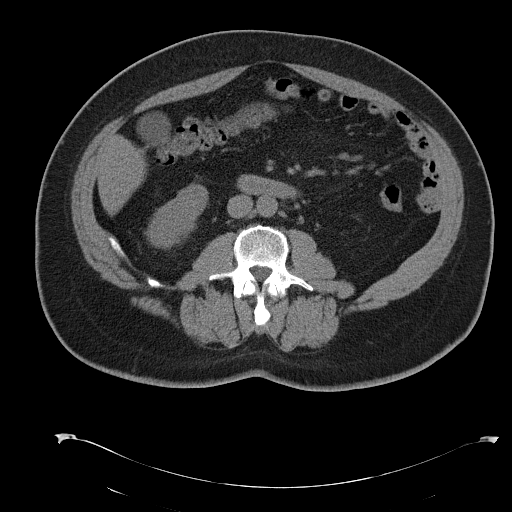
[im 59/96  soft-tissue]
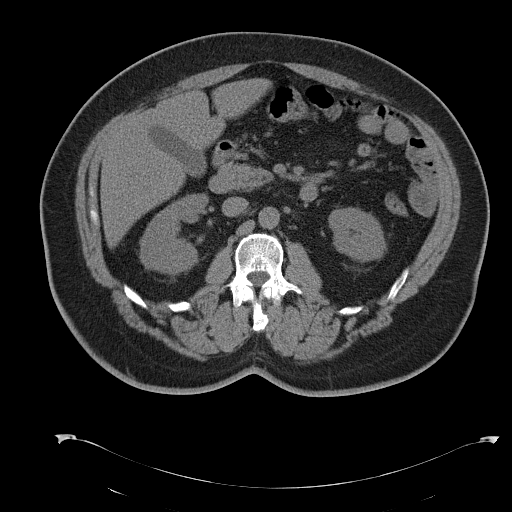
[im 59/96  bone]
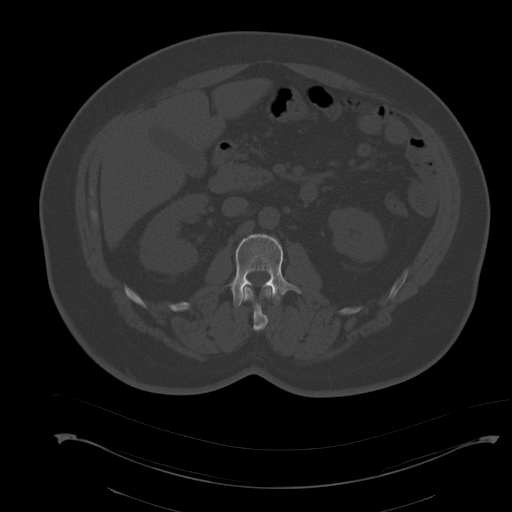
[im 65/96  soft-tissue]
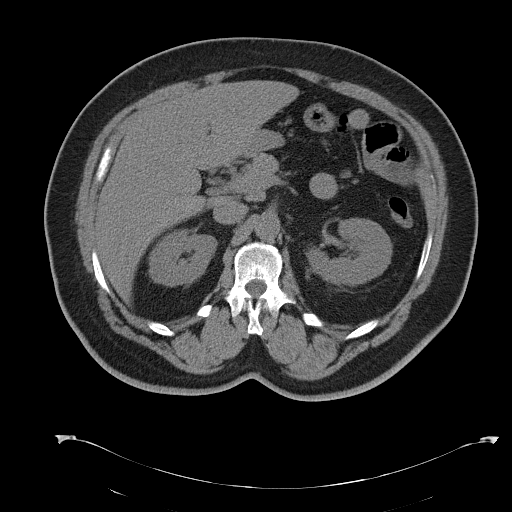
[im 71/96  soft-tissue]
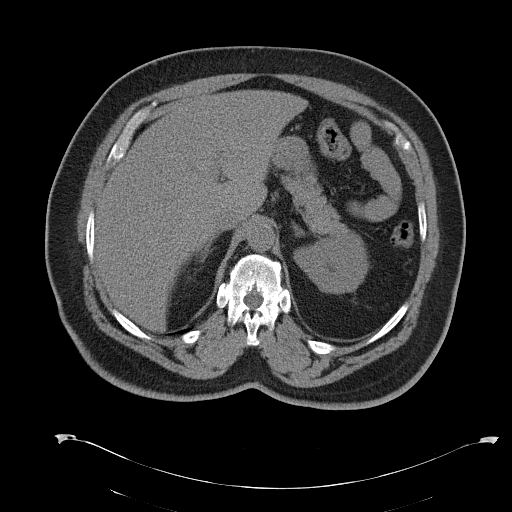
[im 77/96  soft-tissue]
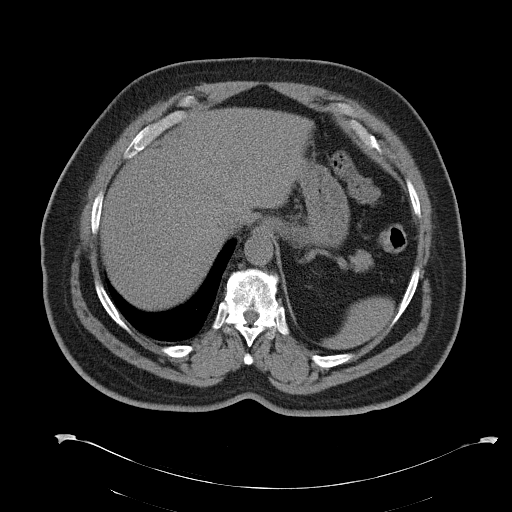
[im 83/96  soft-tissue]
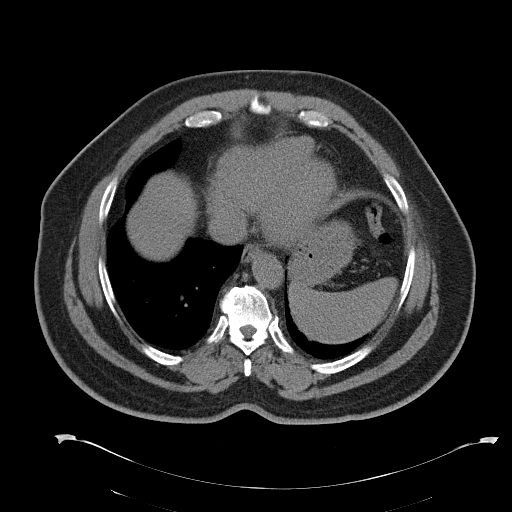
[im 89/96  soft-tissue]
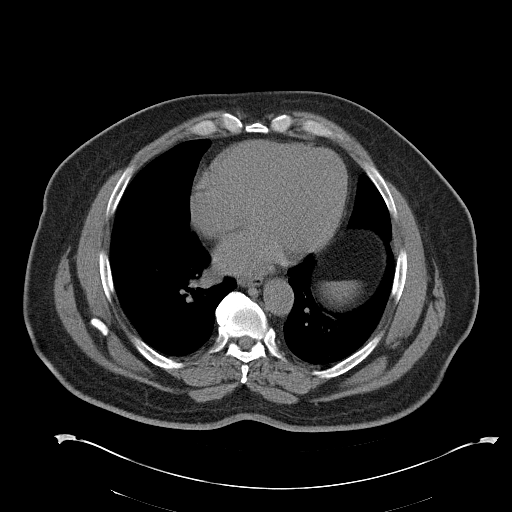

[Series 4: mpr coronal · coronal · 0.85mm/px · 3 of 118 slices shown]
[im 40/118  soft-tissue]
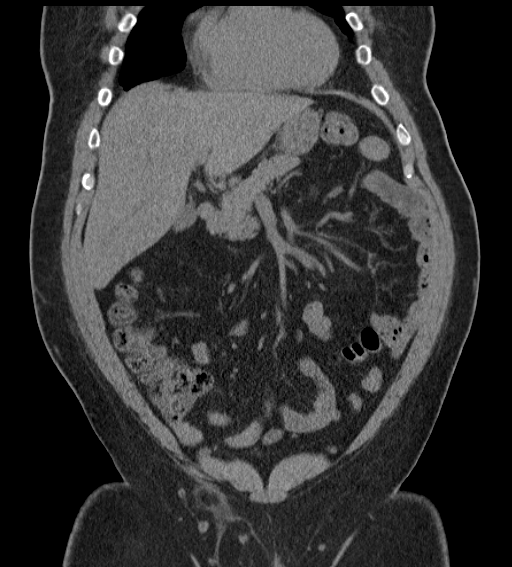
[im 53/118  soft-tissue]
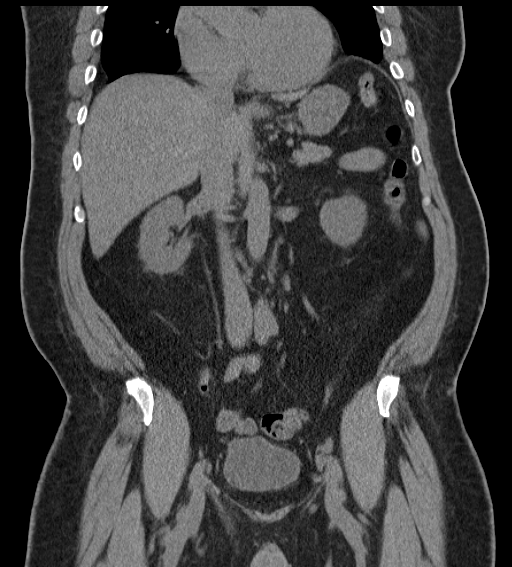
[im 66/118  soft-tissue]
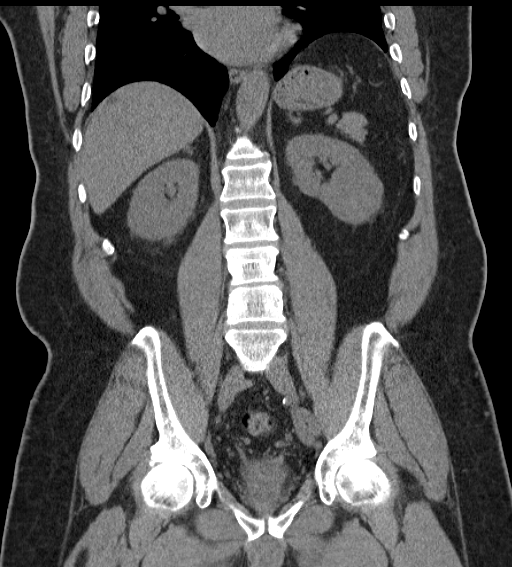

[17 of 46 positions shown; findings below may reference images not displayed]

FINDINGS: There is stable linear scarring or atelectasis in the left lower
lobe. The lung bases are otherwise clear. There is no significant
pleural or pericardial effusion. The heart is mildly enlarged.

There is no evidence of renal, ureteral or bladder calculus. There
is no hydronephrosis or perinephric soft tissue stranding. As
evaluated in the noncontrast state, both kidneys appear
unremarkable.

The liver demonstrates mild steatosis and a stable 1 cm low-density
lesion in the dome of the right lobe (image 17), but no other focal
abnormality on non contrast imaging. Likewise, the gallbladder,
pancreas, spleen and adrenal glands appear normal.

The stomach, small bowel, colon and appendix demonstrate no
significant findings. The bladder, prostate gland and seminal
vesicles appear stable. There is a stable right inguinal hernia
containing fat. Small pelvic and inguinal lymph nodes are stable. No
enlarged lymph nodes or inflammatory changes are seen. Small pelvic
phleboliths are stable.

Lower lumbar spine facet disease is again noted. No worrisome
osseous findings are identified.
IMPRESSION: 1. No acute findings. No evidence of ureteral calculus or
hydronephrosis.
2. Hepatic steatosis.
3. Lower lumbar spine facet disease and right inguinal hernia again
noted.

## 2018-04-11 ENCOUNTER — Emergency Department (HOSPITAL_COMMUNITY): Payer: BLUE CROSS/BLUE SHIELD

## 2018-04-11 ENCOUNTER — Emergency Department (HOSPITAL_COMMUNITY)
Admission: EM | Admit: 2018-04-11 | Discharge: 2018-04-11 | Disposition: A | Discharge status: Home / Self Care | Payer: BLUE CROSS/BLUE SHIELD | Attending: Emergency Medicine | Admitting: Emergency Medicine

## 2018-04-11 ENCOUNTER — Encounter (HOSPITAL_COMMUNITY): Payer: Self-pay | Admitting: Emergency Medicine

## 2018-04-11 ENCOUNTER — Other Ambulatory Visit: Payer: Self-pay

## 2018-04-11 DIAGNOSIS — Z7984 Long term (current) use of oral hypoglycemic drugs: Secondary | ICD-10-CM | POA: Diagnosis not present

## 2018-04-11 DIAGNOSIS — E119 Type 2 diabetes mellitus without complications: Secondary | ICD-10-CM | POA: Insufficient documentation

## 2018-04-11 DIAGNOSIS — K4090 Unilateral inguinal hernia, without obstruction or gangrene, not specified as recurrent: Secondary | ICD-10-CM

## 2018-04-11 DIAGNOSIS — Z7982 Long term (current) use of aspirin: Secondary | ICD-10-CM | POA: Insufficient documentation

## 2018-04-11 DIAGNOSIS — R1031 Right lower quadrant pain: Secondary | ICD-10-CM | POA: Diagnosis present

## 2018-04-11 LAB — BASIC METABOLIC PANEL
Anion gap: 12 (ref 5–15)
BUN: 13 mg/dL (ref 6–20)
CO2: 24 mmol/L (ref 22–32)
Calcium: 8.8 mg/dL — ABNORMAL LOW (ref 8.9–10.3)
Chloride: 103 mmol/L (ref 101–111)
Creatinine, Ser: 0.87 mg/dL (ref 0.61–1.24)
GLUCOSE: 107 mg/dL — AB (ref 65–99)
Potassium: 3.4 mmol/L — ABNORMAL LOW (ref 3.5–5.1)
SODIUM: 139 mmol/L (ref 135–145)

## 2018-04-11 LAB — CBC
HCT: 43.2 % (ref 39.0–52.0)
Hemoglobin: 13.7 g/dL (ref 13.0–17.0)
MCH: 25.8 pg — ABNORMAL LOW (ref 26.0–34.0)
MCHC: 31.7 g/dL (ref 30.0–36.0)
MCV: 81.5 fL (ref 78.0–100.0)
Platelets: 232 10*3/uL (ref 150–400)
RBC: 5.3 MIL/uL (ref 4.22–5.81)
RDW: 14.4 % (ref 11.5–15.5)
WBC: 6.4 10*3/uL (ref 4.0–10.5)

## 2018-04-11 LAB — URINALYSIS, ROUTINE W REFLEX MICROSCOPIC
Bilirubin Urine: NEGATIVE
GLUCOSE, UA: NEGATIVE mg/dL
Hgb urine dipstick: NEGATIVE
KETONES UR: NEGATIVE mg/dL
Leukocytes, UA: NEGATIVE
Nitrite: NEGATIVE
PROTEIN: NEGATIVE mg/dL
Specific Gravity, Urine: 1.005 (ref 1.005–1.030)
pH: 7 (ref 5.0–8.0)

## 2018-04-11 MED ORDER — IOPAMIDOL (ISOVUE-300) INJECTION 61%
100.0000 mL | Freq: Once | INTRAVENOUS | Status: AC | PRN
  Administered 2018-04-11: 100 mL via INTRAVENOUS

## 2018-04-11 MED ORDER — NAPROXEN 500 MG PO TABS: 500.0000 mg | ORAL_TABLET | Freq: Two times a day (BID) | ORAL | 0 refills | Status: DC

## 2018-04-11 MED ORDER — MORPHINE SULFATE (PF) 4 MG/ML IV SOLN
4.0000 mg | Freq: Once | INTRAVENOUS | Status: AC
Start: 2018-04-11 — End: 2018-04-11
  Administered 2018-04-11: 4 mg via INTRAVENOUS
  Filled 2018-04-11: qty 1

## 2018-04-11 NOTE — ED Notes (Signed)
Right groin pain and swelling that started at 3 am.  Has had off and on issues for a while.  Seen for same at [facility] and was supposed to get OP ct scan.  Denies any urinary issues.

## 2018-04-11 NOTE — Discharge Instructions (Addendum)
Take the medications as needed for pain, follow up with Dr Arnoldo Morale to discuss treatment options for the hernia, return for fever, vomiting, worsening symptoms

## 2018-04-11 NOTE — ED Triage Notes (Signed)
Pt reports right testicle swelling for a few months.  States at 3am this morning the pain began again and is not going away.

## 2018-04-11 NOTE — ED Provider Notes (Signed)
[facility] EMERGENCY DEPARTMENT Provider Note   CSN: [id number] Arrival date & time: 04/11/18  3710     History   Chief Complaint Chief Complaint  Patient presents with  . Groin Pain    HPI Peter Lewis is a 62 y.o. male.  HPI Patient presents to the emergency room for evaluation of right groin pain.  Patient states he has had this problem off and on for a few months.  He went to the [place] and they have scheduled outpatient CT scan.  Patient was told he probably has a hernia.  This morning the patient noticed increasing swelling again and has had persistent pain in his right groin since then.  He denies any nausea or vomiting.  No dysuria. Past Medical History:  Diagnosis Date  . Diabetes mellitus without complication ([place])     There are no active problems to display for this patient.   History reviewed. No pertinent surgical history.      Home Medications    Prior to Admission medications   Medication Sig Start Date End Date Taking? Authorizing Provider  aspirin EC 81 MG tablet Take 81 mg by mouth daily.   Yes [provider]  cyclobenzaprine (FLEXERIL) 10 MG tablet Take 10 mg by mouth 3 (three) times daily as needed for muscle spasms.   Yes [provider]  ibuprofen (ADVIL,MOTRIN) 200 MG tablet Take 400 mg by mouth every 6 (six) hours as needed for moderate pain.   Yes [provider]  metFORMIN (GLUCOPHAGE) 500 MG tablet Take 500 mg by mouth 3 (three) times daily after meals.   Yes [provider]  methocarbamol (ROBAXIN) 500 MG tablet Take 500 mg by mouth every 6 (six) hours as needed for muscle spasms.   Yes [provider]  naproxen (NAPROSYN) 500 MG tablet Take 1 tablet (500 mg total) by mouth 2 (two) times daily. 04/11/18   Dorie Rank, MD    Family History History reviewed. No pertinent family history.  Social History Social History   Tobacco Use  . Smoking status: Never Smoker  Substance Use Topics  . Alcohol  use: Yes    Comment: weekends  . Drug use: No     Allergies   Patient has no known allergies.   Review of Systems Review of Systems  All other systems reviewed and are negative.    Physical Exam Updated Vital Signs BP (!) 146/68 (BP Location: Left Arm)   Pulse (!) 55   Temp 98 F (36.7 C) (Oral)   Resp 16   Ht 1.676 m (5\' 6" )   Wt 104.3 kg (230 lb)   SpO2 100%   BMI 37.12 kg/m   Physical Exam  Constitutional: He appears well-developed and well-nourished. No distress.  HENT:  Head: Normocephalic and atraumatic.  Right Ear: External ear normal.  Left Ear: External ear normal.  Eyes: Conjunctivae are normal. Right eye exhibits no discharge. Left eye exhibits no discharge. No scleral icterus.  Neck: Neck supple. No tracheal deviation present.  Cardiovascular: Normal rate.  Pulmonary/Chest: Effort normal. No stridor. No respiratory distress.  Abdominal: Soft. He exhibits no distension and no mass. There is no tenderness. There is no rebound and no guarding. A hernia is present. Hernia confirmed positive in the right inguinal area.  Genitourinary: Right testis shows swelling. Right testis shows no mass and no tenderness. Left testis shows no mass and no tenderness.  Genitourinary Comments: Exam somewhat limited by the patient's body habitus however he  does not appear to have swelling of the testicle, I suspect a hernia, unable to completely reduce  Musculoskeletal: He exhibits no edema.  Neurological: He is alert. Cranial nerve deficit: no gross deficits.  Skin: Skin is warm and dry. No rash noted.  Psychiatric: He has a normal mood and affect.  Nursing note and vitals reviewed.    ED Treatments / Results  Labs (all labs ordered are listed, but only abnormal results are displayed) Labs Reviewed  CBC - Abnormal; Notable for the following components:      Result Value   MCH 25.8 (*)    All other components within normal limits  BASIC METABOLIC PANEL - Abnormal;  Notable for the following components:   Potassium 3.4 (*)    Glucose, Bld 107 (*)    Calcium 8.8 (*)    All other components within normal limits  URINALYSIS, ROUTINE W REFLEX MICROSCOPIC - Abnormal; Notable for the following components:   Color, Urine STRAW (*)    All other components within normal limits    EKG None  Radiology Ct Abdomen Pelvis W Contrast  Result Date: 04/11/2018 CLINICAL DATA:  Right groin pain and scrotal swelling for several months. Hernia suspected. EXAM: CT ABDOMEN AND PELVIS WITH CONTRAST TECHNIQUE: Multidetector CT imaging of the abdomen and pelvis was performed using the standard protocol following bolus administration of intravenous contrast. CONTRAST:  182mL ISOVUE-300 IOPAMIDOL (ISOVUE-300) INJECTION 61% COMPARISON:  06/13/2014 FINDINGS: Lower Chest: No acute findings. Hepatobiliary: No hepatic masses identified. Stable tiny cyst in lateral dome of right lobe. Gallbladder is unremarkable. Pancreas:  No mass or inflammatory changes. Spleen: Within normal limits in size and appearance. Adrenals/Urinary Tract: No masses identified. No evidence of hydronephrosis. Stomach/Bowel: No evidence of obstruction, inflammatory process or abnormal fluid collections. Normal appendix visualized. Vascular/Lymphatic: No pathologically enlarged lymph nodes. No abdominal aortic aneurysm. Reproductive:  No mass or other significant abnormality. Other: A small to moderate right inguinal hernia is seen containing only fat, without significant change compared to prior study. Musculoskeletal:  No suspicious bone lesions identified. IMPRESSION: No acute findings. Stable small to moderate right inguinal hernia containing only fat. Electronically Signed   By: Earle Gell M.D.   On: 04/11/2018 12:19    Procedures Procedures (including critical care time)  Medications Ordered in ED Medications  morphine 4 MG/ML injection 4 mg (4 mg Intravenous Given 04/11/18 1029)  iopamidol (ISOVUE-300) 61 %  injection 100 mL (100 mLs Intravenous Contrast Given 04/11/18 1122)     Initial Impression / Assessment and Plan / ED Course  I have reviewed the triage vital signs and the nursing notes.  Pertinent labs & imaging results that were available during my care of the patient were reviewed by me and considered in my medical decision making (see chart for details).   PT presented to the ED for evaluation of groin pain.  Exam  Suggestive of hernia.  CT scan confirms hernia without any bowel involvement.  Pt's sx improved with treatment.  Stable for discharge.  Refer to gen surgery Final Clinical Impressions(s) / ED Diagnoses   Final diagnoses:  Unilateral inguinal hernia without obstruction or gangrene, recurrence not specified    ED Discharge Orders        Ordered    naproxen (NAPROSYN) 500 MG tablet  2 times daily     04/11/18 1250       Dorie Rank, MD 04/11/18 1253

## 2018-04-29 ENCOUNTER — Encounter: Payer: Self-pay | Admitting: General Surgery

## 2018-04-29 ENCOUNTER — Ambulatory Visit: Payer: BLUE CROSS/BLUE SHIELD | Admitting: General Surgery

## 2018-04-29 VITALS — BP 145/84 | HR 68 | Temp 98.6°F | Resp 20 | Wt 226.0 lb

## 2018-04-29 DIAGNOSIS — K4090 Unilateral inguinal hernia, without obstruction or gangrene, not specified as recurrent: Secondary | ICD-10-CM | POA: Diagnosis not present

## 2018-04-29 NOTE — Progress Notes (Signed)
Peter Lewis; [id number]; 1956-01-11   HPI Patient is a 62 year old black male who was referred to my care by the emergency room for evaluation and treatment of a right inguinal hernia.  Patient states that he has had the hernia for many months, but recently has increased in size and is causing him discomfort.  His pain is 5 out of 10 when it is sticking out.  Is made worse with straining.  He denies any nausea or vomiting.  The pain radiates to his right testicle. Past Medical History:  Diagnosis Date  . Diabetes mellitus without complication ([place])     History reviewed. No pertinent surgical history.  History reviewed. No pertinent family history.  Current Outpatient Medications on File Prior to Visit  Medication Sig Dispense Refill  . aspirin EC 81 MG tablet Take 81 mg by mouth daily.    . cyclobenzaprine (FLEXERIL) 10 MG tablet Take 10 mg by mouth 3 (three) times daily as needed for muscle spasms.    Marland Kitchen ibuprofen (ADVIL,MOTRIN) 200 MG tablet Take 400 mg by mouth every 6 (six) hours as needed for moderate pain.    . metFORMIN (GLUCOPHAGE) 500 MG tablet Take 500 mg by mouth 3 (three) times daily after meals.    . methocarbamol (ROBAXIN) 500 MG tablet Take 500 mg by mouth every 6 (six) hours as needed for muscle spasms.    . naproxen (NAPROSYN) 500 MG tablet Take 1 tablet (500 mg total) by mouth 2 (two) times daily. 30 tablet 0   No current facility-administered medications on file prior to visit.     No Known Allergies  Social History   Substance and Sexual Activity  Alcohol Use Yes   Comment: weekends    Social History   Tobacco Use  Smoking Status Never Smoker  Smokeless Tobacco Never Used    Review of Systems  Constitutional: Negative.   HENT: Negative.   Eyes: Negative.   Respiratory: Negative.   Cardiovascular: Negative.   Gastrointestinal: Negative.   Genitourinary: Negative.   Musculoskeletal: Positive for back pain, joint pain and neck pain.  Skin:  Negative.   Neurological: Negative.   Endo/Heme/Allergies: Negative.   Psychiatric/Behavioral: Negative.     Objective   Vitals:   04/29/18 1019  BP: (!) 145/84  Pulse: 68  Resp: 20  Temp: 98.6 F (37 C)    Physical Exam  Constitutional: He appears well-developed and well-nourished.  HENT:  Head: Normocephalic and atraumatic.  Cardiovascular: Normal rate, regular rhythm and normal heart sounds. Exam reveals no gallop and no friction rub.  No murmur heard. Pulmonary/Chest: Effort normal and breath sounds normal. No stridor. No respiratory distress. He has no wheezes. He has no rales.  Abdominal: Soft. Bowel sounds are normal. He exhibits no distension and no mass. There is no tenderness. There is no guarding. A hernia is present.  Reducible right inguinal hernia noted.  Genitourinary:  Genitourinary Comments: Difficult to fully assess whether patient has right hydrocele.  Was tender to touch in that area.  Vitals reviewed.  ER notes reviewed.  Assessment  Right inguinal hernia Plan   Patient is scheduled for right inguinal herniorrhaphy with mesh on 05/14/2018.  The risks and benefits of the procedure including bleeding, infection, mesh use, and the possibility of recurrence of the hernia were fully explained to the patient, who gave informed consent.

## 2018-04-29 NOTE — Patient Instructions (Signed)

## 2018-04-29 NOTE — H&P (Signed)
Peter Lewis; [id number]; Mar 27, 1956   HPI Patient is a 62 year old black male who was referred to my care by the emergency room for evaluation and treatment of a right inguinal hernia.  Patient states that he has had the hernia for many months, but recently has increased in size and is causing him discomfort.  His pain is 5 out of 10 when it is sticking out.  Is made worse with straining.  He denies any nausea or vomiting.  The pain radiates to his right testicle. Past Medical History:  Diagnosis Date  . Diabetes mellitus without complication ([place])     History reviewed. No pertinent surgical history.  History reviewed. No pertinent family history.  Current Outpatient Medications on File Prior to Visit  Medication Sig Dispense Refill  . aspirin EC 81 MG tablet Take 81 mg by mouth daily.    . cyclobenzaprine (FLEXERIL) 10 MG tablet Take 10 mg by mouth 3 (three) times daily as needed for muscle spasms.    Marland Kitchen ibuprofen (ADVIL,MOTRIN) 200 MG tablet Take 400 mg by mouth every 6 (six) hours as needed for moderate pain.    . metFORMIN (GLUCOPHAGE) 500 MG tablet Take 500 mg by mouth 3 (three) times daily after meals.    . methocarbamol (ROBAXIN) 500 MG tablet Take 500 mg by mouth every 6 (six) hours as needed for muscle spasms.    . naproxen (NAPROSYN) 500 MG tablet Take 1 tablet (500 mg total) by mouth 2 (two) times daily. 30 tablet 0   No current facility-administered medications on file prior to visit.     No Known Allergies  Social History   Substance and Sexual Activity  Alcohol Use Yes   Comment: weekends    Social History   Tobacco Use  Smoking Status Never Smoker  Smokeless Tobacco Never Used    Review of Systems  Constitutional: Negative.   HENT: Negative.   Eyes: Negative.   Respiratory: Negative.   Cardiovascular: Negative.   Gastrointestinal: Negative.   Genitourinary: Negative.   Musculoskeletal: Positive for back pain, joint pain and neck pain.  Skin:  Negative.   Neurological: Negative.   Endo/Heme/Allergies: Negative.   Psychiatric/Behavioral: Negative.     Objective   Vitals:   04/29/18 1019  BP: (!) 145/84  Pulse: 68  Resp: 20  Temp: 98.6 F (37 C)    Physical Exam  Constitutional: He appears well-developed and well-nourished.  HENT:  Head: Normocephalic and atraumatic.  Cardiovascular: Normal rate, regular rhythm and normal heart sounds. Exam reveals no gallop and no friction rub.  No murmur heard. Pulmonary/Chest: Effort normal and breath sounds normal. No stridor. No respiratory distress. He has no wheezes. He has no rales.  Abdominal: Soft. Bowel sounds are normal. He exhibits no distension and no mass. There is no tenderness. There is no guarding. A hernia is present.  Reducible right inguinal hernia noted.  Genitourinary:  Genitourinary Comments: Difficult to fully assess whether patient has right hydrocele.  Was tender to touch in that area.  Vitals reviewed.  ER notes reviewed.  Assessment  Right inguinal hernia Plan   Patient is scheduled for right inguinal herniorrhaphy with mesh on 05/14/2018.  The risks and benefits of the procedure including bleeding, infection, mesh use, and the possibility of recurrence of the hernia were fully explained to the patient, who gave informed consent.

## 2018-05-05 NOTE — Patient Instructions (Addendum)
Peter Lewis  05/05/2018     @PREFPERIOPPHARMACY @   Your procedure is scheduled on 05/14/2018.  Report to [facility] at  6:40 A.M.  Call this number if you have problems the morning of surgery:  [phone]   Remember:  No food or drink after midnight.     Take these medicines the morning of surgery with A SIP OF WATER May take flexeril OR robaxin if needed    Do not wear jewelry, make-up or nail polish.  Do not wear lotions, powders, or perfumes, or deodorant.  Do not shave 48 hours prior to surgery.  Men may shave face and neck.  Do not bring valuables to the hospital.  [facility] is not responsible for any belongings or valuables.  Contacts, dentures or bridgework may not be worn into surgery.  Leave your suitcase in the car.  After surgery it may be brought to your room.  For patients admitted to the hospital, discharge time will be determined by your treatment team.  Patients discharged the day of surgery will not be allowed to drive home.    Please read over the following fact sheets that you were given. Surgical Site Infection Prevention and Anesthesia Post-op Instructions     PATIENT INSTRUCTIONS POST-ANESTHESIA  IMMEDIATELY FOLLOWING SURGERY:  Do not drive or operate machinery for the first twenty four hours after surgery.  Do not make any important decisions for twenty four hours after surgery or while taking narcotic pain medications or sedatives.  If you develop intractable nausea and vomiting or a severe headache please notify your doctor immediately.  FOLLOW-UP:  Please make an appointment with your surgeon as instructed. You do not need to follow up with anesthesia unless specifically instructed to do so.  WOUND CARE INSTRUCTIONS (if applicable):  Keep a dry clean dressing on the anesthesia/puncture wound site if there is drainage.  Once the wound has quit draining you may leave it open to air.  Generally you should leave the bandage intact for twenty  four hours unless there is drainage.  If the epidural site drains for more than 36-48 hours please call the anesthesia department.  QUESTIONS?:  Please feel free to call your physician or the hospital operator if you have any questions, and they will be happy to assist you.      Laparoscopic Inguinal Hernia Repair, Adult Laparoscopic inguinal hernia repair is a surgical procedure to repair a small weak spot in the groin muscles that allows fat or intestine from inside the abdomen to bulge out (inguinal hernia). This procedure may be planned, or it may be an emergency procedure. During the procedure, tissue that has bulged out is moved back into place, and the opening in the groin muscles is repaired. This is done through three small incisions in the abdomen. A thin tube with a light and camera on the end (laparoscope) will be used to help perform the procedure. Tell a health care provider about:  Any allergies you have.  All medicines you are taking, including vitamins, herbs, eye drops, creams, and over-the-counter medicines.  Any problems you or family members have had with anesthetic medicines.  Any blood disorders you have.  Any surgeries you have had.  Any medical conditions you have.  Whether you are pregnant or may be pregnant. What are the risks? Generally, this is a safe procedure. However, problems may occur, including:  Infection.  Bleeding.  Allergic reactions to medicines.  Damage to other structures or organs.  Long-term pain and swelling of the scrotum, in men.  Testicle damage in men.  Inability to completely empty the bladder (urinary retention).  A collection of fluid that builds up under the skin (seroma).  The hernia coming back (recurrence).  What happens before the procedure? Medicines  Ask your health care provider about: ? Changing or stopping your regular medicines. This is especially important if you are taking diabetes medicines or blood  thinners. ? Taking over-the-counter medicines, vitamins, herbs, and supplements. ? Taking medicines such as aspirin and ibuprofen. These medicines can thin your blood. Do not take these medicines unless your health care provider tells you to take them.  You may be given antibiotic medicine to help prevent an infection. Staying hydrated Follow instructions from your health care provider about hydration, which may include:  Up to 2 hours before the procedure - you may continue to drink clear liquids, such as water, clear fruit juice, black coffee, and plain tea.  Eating and drinking restrictions Follow instructions from your health care provider about eating and drinking, which may include:  8 hours before the procedure - stop eating heavy meals or foods such as meat, fried foods, or fatty foods.  6 hours before the procedure - stop eating light meals or foods, such as toast or cereal.  6 hours before the procedure - stop drinking milk or drinks that contain milk.  2 hours before the procedure - stop drinking clear liquids.  General instructions  Do not use any products that contain nicotine or tobacco, such as cigarettes and e-cigarettes. If you need help quitting, ask your health care provider.  You may be asked to shower with a germ-killing soap.  Plan to have someone take you home from the hospital or clinic.  Plan to have a responsible adult care for you for at least 24 hours after you leave the hospital or clinic. This is important. What happens during the procedure?  To lower your risk of infection: ? Your health care team will wash or sanitize their hands. ? Hair may be removed from the surgical area. ? Your skin will be washed with soap.  An IV will be inserted into one of your veins.  You will be given one or more of the following: ? A medicine to help you relax (sedative). ? A medicine to make you fall asleep (general anesthetic).  Three small incisions will be  made in your abdomen.  Your abdomen will be inflated with carbon dioxide gas to make the surgical area easier to see.  A laparoscope and surgical instruments will be inserted through the incisions. The laparoscope will send images of the inside of your abdomen to a monitor in the room.  Tissue that is bulging through the hernia may be removed or moved back into its normal place.  The hernia opening will be closed with a sheet of surgical mesh.  The surgical instruments and laparoscope will be removed.  Your incisions will be closed with stitches (sutures) and adhesive strips.  A bandage (dressing) will be placed over your incisions. The procedure may vary among health care providers and hospitals. What happens after the procedure?  Your blood pressure, heart rate, breathing rate, and blood oxygen level will be monitored until the medicines you were given have worn off.  You will be given pain medicine as needed.  You may continue to receive medicines and fluids through an IV. The IV will be removed after you can drink fluids well.  You will be encouraged to get up and move around, and to take deep breaths frequently.  Do not drive for 24 hours if you were given a sedative during your procedure. Summary  Laparoscopic inguinal hernia repair is a surgical procedure to repair a small weak spot in the groin muscles that allows fat or intestine from inside the abdomen to bulge out (inguinal hernia).  This procedure is done through three small incisions in the abdomen. A thin tube with a light and camera on the end (laparoscope) will be used to help perform the procedure.  After the procedure, you will be encouraged to get up and move around, and to take deep breaths frequently. This information is not intended to replace advice given to you by your health care provider. Make sure you discuss any questions you have with your health care provider. Document Released: 03/12/2017 Document  Revised: 03/12/2017 Document Reviewed: 03/12/2017 Elsevier Interactive Patient Education  2018 [employer].

## 2018-05-07 ENCOUNTER — Encounter (HOSPITAL_COMMUNITY)
Admission: RE | Admit: 2018-05-07 | Discharge: 2018-05-07 | Disposition: A | Discharge status: Home / Self Care | Payer: BLUE CROSS/BLUE SHIELD | Source: Ambulatory Visit | Attending: General Surgery | Admitting: General Surgery

## 2018-05-07 ENCOUNTER — Other Ambulatory Visit: Payer: Self-pay

## 2018-05-07 ENCOUNTER — Encounter (HOSPITAL_COMMUNITY): Payer: Self-pay

## 2018-05-07 DIAGNOSIS — E119 Type 2 diabetes mellitus without complications: Secondary | ICD-10-CM | POA: Insufficient documentation

## 2018-05-07 DIAGNOSIS — I252 Old myocardial infarction: Secondary | ICD-10-CM | POA: Insufficient documentation

## 2018-05-07 DIAGNOSIS — K219 Gastro-esophageal reflux disease without esophagitis: Secondary | ICD-10-CM | POA: Insufficient documentation

## 2018-05-07 DIAGNOSIS — Z01812 Encounter for preprocedural laboratory examination: Secondary | ICD-10-CM | POA: Insufficient documentation

## 2018-05-07 DIAGNOSIS — Z7984 Long term (current) use of oral hypoglycemic drugs: Secondary | ICD-10-CM | POA: Diagnosis not present

## 2018-05-07 HISTORY — DX: Gastro-esophageal reflux disease without esophagitis: K21.9

## 2018-05-07 HISTORY — DX: Unspecified osteoarthritis, unspecified site: M19.90

## 2018-05-07 HISTORY — DX: Sleep apnea, unspecified: G47.30

## 2018-05-07 LAB — CBC
HCT: 42.4 % (ref 39.0–52.0)
Hemoglobin: 13.8 g/dL (ref 13.0–17.0)
MCH: 26.5 pg (ref 26.0–34.0)
MCHC: 32.5 g/dL (ref 30.0–36.0)
MCV: 81.4 fL (ref 78.0–100.0)
Platelets: 187 10*3/uL (ref 150–400)
RBC: 5.21 MIL/uL (ref 4.22–5.81)
RDW: 14.3 % (ref 11.5–15.5)
WBC: 5.4 10*3/uL (ref 4.0–10.5)

## 2018-05-07 LAB — BASIC METABOLIC PANEL
Anion gap: 8 (ref 5–15)
BUN: 12 mg/dL (ref 6–20)
CALCIUM: 8.6 mg/dL — AB (ref 8.9–10.3)
CHLORIDE: 102 mmol/L (ref 101–111)
CO2: 26 mmol/L (ref 22–32)
Creatinine, Ser: 0.76 mg/dL (ref 0.61–1.24)
GFR calc non Af Amer: >60 mL/min (ref >60)
GLUCOSE: 110 mg/dL — AB (ref 65–99)
Potassium: 3.5 mmol/L (ref 3.5–5.1)
Sodium: 136 mmol/L (ref 135–145)

## 2018-05-07 LAB — GLUCOSE, CAPILLARY: Glucose-Capillary: 113 mg/dL — ABNORMAL HIGH (ref 65–99)

## 2018-05-07 LAB — HEMOGLOBIN A1C
Hgb A1c MFr Bld: 5.4 % (ref 4.8–5.6)
Mean Plasma Glucose: 108.28 mg/dL

## 2018-05-14 ENCOUNTER — Ambulatory Visit (HOSPITAL_COMMUNITY): Payer: BLUE CROSS/BLUE SHIELD | Admitting: Anesthesiology

## 2018-05-14 ENCOUNTER — Other Ambulatory Visit: Payer: Self-pay

## 2018-05-14 ENCOUNTER — Ambulatory Visit (HOSPITAL_COMMUNITY)
Admission: RE | Admit: 2018-05-14 | Discharge: 2018-05-14 | Disposition: A | Discharge status: Home / Self Care | Payer: BLUE CROSS/BLUE SHIELD | Source: Ambulatory Visit | Attending: General Surgery | Admitting: General Surgery

## 2018-05-14 ENCOUNTER — Encounter (HOSPITAL_COMMUNITY)
Admission: RE | Disposition: A | Discharge status: Home / Self Care | Payer: Self-pay | Source: Ambulatory Visit | Attending: General Surgery

## 2018-05-14 ENCOUNTER — Encounter (HOSPITAL_COMMUNITY): Payer: Self-pay | Admitting: *Deleted

## 2018-05-14 DIAGNOSIS — Z7982 Long term (current) use of aspirin: Secondary | ICD-10-CM | POA: Diagnosis not present

## 2018-05-14 DIAGNOSIS — Z791 Long term (current) use of non-steroidal anti-inflammatories (NSAID): Secondary | ICD-10-CM | POA: Insufficient documentation

## 2018-05-14 DIAGNOSIS — D176 Benign lipomatous neoplasm of spermatic cord: Secondary | ICD-10-CM | POA: Insufficient documentation

## 2018-05-14 DIAGNOSIS — K4091 Unilateral inguinal hernia, without obstruction or gangrene, recurrent: Secondary | ICD-10-CM | POA: Diagnosis present

## 2018-05-14 DIAGNOSIS — Z7984 Long term (current) use of oral hypoglycemic drugs: Secondary | ICD-10-CM | POA: Diagnosis not present

## 2018-05-14 DIAGNOSIS — I252 Old myocardial infarction: Secondary | ICD-10-CM | POA: Diagnosis not present

## 2018-05-14 DIAGNOSIS — Z79899 Other long term (current) drug therapy: Secondary | ICD-10-CM | POA: Diagnosis not present

## 2018-05-14 DIAGNOSIS — E119 Type 2 diabetes mellitus without complications: Secondary | ICD-10-CM | POA: Diagnosis not present

## 2018-05-14 HISTORY — PX: INGUINAL HERNIA REPAIR: SHX194

## 2018-05-14 LAB — GLUCOSE, CAPILLARY
GLUCOSE-CAPILLARY: 116 mg/dL — AB (ref 65–99)
Glucose-Capillary: 109 mg/dL — ABNORMAL HIGH (ref 65–99)

## 2018-05-14 SURGERY — REPAIR, HERNIA, INGUINAL, ADULT
Anesthesia: General | Site: Groin | Laterality: Right

## 2018-05-14 MED ORDER — EPHEDRINE SULFATE 50 MG/ML IJ SOLN
INTRAMUSCULAR | Status: DC | PRN
  Administered 2018-05-14 (×3): 5 mg via INTRAVENOUS

## 2018-05-14 MED ORDER — FENTANYL CITRATE (PF) 250 MCG/5ML IJ SOLN
INTRAMUSCULAR | Status: AC
  Filled 2018-05-14: qty 5

## 2018-05-14 MED ORDER — FENTANYL CITRATE (PF) 100 MCG/2ML IJ SOLN
INTRAMUSCULAR | Status: DC | PRN
  Administered 2018-05-14 (×4): 25 ug via INTRAVENOUS

## 2018-05-14 MED ORDER — PROPOFOL 10 MG/ML IV BOLUS
INTRAVENOUS | Status: AC
  Filled 2018-05-14: qty 40

## 2018-05-14 MED ORDER — EPHEDRINE SULFATE 50 MG/ML IJ SOLN
INTRAMUSCULAR | Status: AC
  Filled 2018-05-14: qty 1

## 2018-05-14 MED ORDER — HYDROCODONE-ACETAMINOPHEN 7.5-325 MG PO TABS: 1.0000 | ORAL_TABLET | Freq: Once | ORAL | Status: DC | PRN

## 2018-05-14 MED ORDER — KETOROLAC TROMETHAMINE 30 MG/ML IJ SOLN
30.0000 mg | Freq: Once | INTRAMUSCULAR | Status: AC
  Administered 2018-05-14: 30 mg via INTRAVENOUS

## 2018-05-14 MED ORDER — BUPIVACAINE LIPOSOME 1.3 % IJ SUSP
INTRAMUSCULAR | Status: AC
  Filled 2018-05-14: qty 20

## 2018-05-14 MED ORDER — LACTATED RINGERS IV SOLN
INTRAVENOUS | Status: DC
  Administered 2018-05-14: 08:00:00 via INTRAVENOUS

## 2018-05-14 MED ORDER — SODIUM CHLORIDE 0.9 % IR SOLN
Status: DC | PRN
  Administered 2018-05-14: 1000 mL

## 2018-05-14 MED ORDER — KETOROLAC TROMETHAMINE 30 MG/ML IJ SOLN
INTRAMUSCULAR | Status: AC
  Filled 2018-05-14: qty 1

## 2018-05-14 MED ORDER — FENTANYL CITRATE (PF) 100 MCG/2ML IJ SOLN
25.0000 ug | INTRAMUSCULAR | Status: DC | PRN
  Administered 2018-05-14 (×2): 50 ug via INTRAVENOUS
  Filled 2018-05-14: qty 2

## 2018-05-14 MED ORDER — CEFAZOLIN SODIUM-DEXTROSE 2-4 GM/100ML-% IV SOLN
2.0000 g | INTRAVENOUS | Status: AC
  Administered 2018-05-14: 2 g via INTRAVENOUS

## 2018-05-14 MED ORDER — CEFAZOLIN SODIUM-DEXTROSE 2-4 GM/100ML-% IV SOLN
INTRAVENOUS | Status: AC
  Filled 2018-05-14: qty 100

## 2018-05-14 MED ORDER — PROPOFOL 10 MG/ML IV BOLUS
INTRAVENOUS | Status: DC | PRN
  Administered 2018-05-14: 150 mg via INTRAVENOUS
  Administered 2018-05-14: 20 mg via INTRAVENOUS

## 2018-05-14 MED ORDER — CHLORHEXIDINE GLUCONATE CLOTH 2 % EX PADS: 6.0000 | MEDICATED_PAD | Freq: Once | CUTANEOUS | Status: DC

## 2018-05-14 MED ORDER — OXYCODONE-ACETAMINOPHEN 7.5-325 MG PO TABS: 1.0000 | ORAL_TABLET | Freq: Four times a day (QID) | ORAL | 0 refills | Status: DC | PRN

## 2018-05-14 MED ORDER — BUPIVACAINE LIPOSOME 1.3 % IJ SUSP
INTRAMUSCULAR | Status: DC | PRN
  Administered 2018-05-14: 20 mL

## 2018-05-14 MED ORDER — SODIUM CHLORIDE 0.9 % IJ SOLN
INTRAMUSCULAR | Status: AC
  Filled 2018-05-14: qty 10

## 2018-05-14 SURGICAL SUPPLY — 40 items
ADH SKN CLS APL DERMABOND .7 (GAUZE/BANDAGES/DRESSINGS) ×1
CLOTH BEACON ORANGE TIMEOUT ST (SAFETY) ×2 IMPLANT
COVER LIGHT HANDLE STERIS (MISCELLANEOUS) ×4 IMPLANT
DERMABOND ADVANCED (GAUZE/BANDAGES/DRESSINGS) ×1
DERMABOND ADVANCED .7 DNX12 (GAUZE/BANDAGES/DRESSINGS) ×1 IMPLANT
DRAIN PENROSE 18X1/2 LTX STRL (DRAIN) ×2 IMPLANT
ELECT REM PT RETURN 9FT ADLT (ELECTROSURGICAL) ×2
ELECTRODE REM PT RTRN 9FT ADLT (ELECTROSURGICAL) ×1 IMPLANT
GAUZE SPONGE 4X4 12PLY STRL (GAUZE/BANDAGES/DRESSINGS) ×2 IMPLANT
GLOVE BIO SURGEON STRL SZ7 (GLOVE) ×2 IMPLANT
GLOVE BIOGEL PI IND STRL 6.5 (GLOVE) IMPLANT
GLOVE BIOGEL PI IND STRL 7.0 (GLOVE) ×2 IMPLANT
GLOVE BIOGEL PI INDICATOR 6.5 (GLOVE) ×1
GLOVE BIOGEL PI INDICATOR 7.0 (GLOVE) ×2
GLOVE SURG SS PI 7.5 STRL IVOR (GLOVE) ×2 IMPLANT
GOWN STRL REUS W/ TWL XL LVL3 (GOWN DISPOSABLE) ×1 IMPLANT
GOWN STRL REUS W/TWL LRG LVL3 (GOWN DISPOSABLE) ×4 IMPLANT
GOWN STRL REUS W/TWL XL LVL3 (GOWN DISPOSABLE) ×2
INST SET MINOR GENERAL (KITS) ×2 IMPLANT
KIT TURNOVER KIT A (KITS) ×2 IMPLANT
MANIFOLD NEPTUNE II (INSTRUMENTS) ×2 IMPLANT
MESH HERNIA 1.6X1.9 PLUG LRG (Mesh General) IMPLANT
MESH HERNIA PLUG LRG (Mesh General) ×1 IMPLANT
NDL HYPO 21X1.5 SAFETY (NEEDLE) ×1 IMPLANT
NEEDLE HYPO 21X1.5 SAFETY (NEEDLE) ×2 IMPLANT
NS IRRIG 1000ML POUR BTL (IV SOLUTION) ×2 IMPLANT
PACK MINOR (CUSTOM PROCEDURE TRAY) ×2 IMPLANT
PAD ARMBOARD 7.5X6 YLW CONV (MISCELLANEOUS) ×2 IMPLANT
SET BASIN LINEN APH (SET/KITS/TRAYS/PACK) ×2 IMPLANT
SUT MNCRL AB 4-0 PS2 18 (SUTURE) ×2 IMPLANT
SUT NOVA NAB GS-22 2 2-0 T-19 (SUTURE) ×4 IMPLANT
SUT PROLENE 2 0 SH 30 (SUTURE) IMPLANT
SUT SILK 3 0 (SUTURE)
SUT SILK 3-0 18XBRD TIE 12 (SUTURE) IMPLANT
SUT VIC AB 2-0 CT1 27 (SUTURE) ×2
SUT VIC AB 2-0 CT1 TAPERPNT 27 (SUTURE) ×1 IMPLANT
SUT VIC AB 3-0 SH 27 (SUTURE) ×2
SUT VIC AB 3-0 SH 27X BRD (SUTURE) ×1 IMPLANT
SUT VICRYL AB 3 0 TIES (SUTURE) ×1 IMPLANT
SYR 20CC LL (SYRINGE) ×2 IMPLANT

## 2018-05-14 NOTE — Discharge Instructions (Signed)
Open Hernia Repair, Adult, Care After °This sheet gives you information about how to care for yourself after your procedure. Your health care provider may also give you more specific instructions. If you have problems or questions, contact your health care provider. °What can I expect after the procedure? °After the procedure, it is common to have: °· Mild discomfort. °· Slight bruising. °· Minor swelling. °· Pain in the abdomen. ° °Follow these instructions at home: °Incision care ° °· Follow instructions from your health care provider about how to take care of your incision area. Make sure you: °? Wash your hands with soap and water before you change your bandage (dressing). If soap and water are not available, use hand sanitizer. °? Change your dressing as told by your health care provider. °? Leave stitches (sutures), skin glue, or adhesive strips in place. These skin closures may need to stay in place for 2 weeks or longer. If adhesive strip edges start to loosen and curl up, you may trim the loose edges. Do not remove adhesive strips completely unless your health care provider tells you to do that. °· Check your incision area every day for signs of infection. Check for: °? More redness, swelling, or pain. °? More fluid or blood. °? Warmth. °? Pus or a bad smell. °Activity °· Do not drive or use heavy machinery while taking prescription pain medicine. Do not drive until your health care provider approves. °· Until your health care provider approves: °? Do not lift anything that is heavier than 10 lb (4.5 kg). °? Do not play contact sports. °· Return to your normal activities as told by your health care provider. Ask your health care provider what activities are safe. °General instructions °· To prevent or treat constipation while you are taking prescription pain medicine, your health care provider may recommend that you: °? Drink enough fluid to keep your urine clear or pale yellow. °? Take over-the-counter or  prescription medicines. °? Eat foods that are high in fiber, such as fresh fruits and vegetables, whole grains, and beans. °? Limit foods that are high in fat and processed sugars, such as fried and sweet foods. °· Take over-the-counter and prescription medicines only as told by your health care provider. °· Do not take tub baths or go swimming until your health care provider approves. °· Keep all follow-up visits as told by your health care provider. This is important. °Contact a health care provider if: °· You develop a rash. °· You have more redness, swelling, or pain around your incision. °· You have more fluid or blood coming from your incision. °· Your incision feels warm to the touch. °· You have pus or a bad smell coming from your incision. °· You have a fever or chills. °· You have blood in your stool (feces). °· You have not had a bowel movement in 2-3 days. °· Your pain is not controlled with medicine. °Get help right away if: °· You have chest pain or shortness of breath. °· You feel light-headed or feel faint. °· You have severe pain. °· You vomit and your pain is worse. °This information is not intended to replace advice given to you by your health care provider. Make sure you discuss any questions you have with your health care provider. °Document Released: 06/20/2005 Document Revised: 06/20/2016 Document Reviewed: 05/14/2016 °Elsevier Interactive Patient Education © 2018 [employer] ° °

## 2018-05-14 NOTE — Transfer of Care (Signed)
Immediate Anesthesia Transfer of Care Note  Patient: Peter Lewis  Procedure(s) Performed: RECURRENT HERNIA REPAIR INGUINAL ADULT WITH MESH (Right Groin)  Patient Location: PACU  Anesthesia Type:General  Level of Consciousness: awake and patient cooperative  Airway & Oxygen Therapy: Patient Spontanous Breathing  Post-op Assessment: Report given to RN and Post -op Vital signs reviewed and stable  Post vital signs: Reviewed and stable  Last Vitals:  Vitals Value Taken Time  BP    Temp    Pulse    Resp    SpO2      Last Pain:  Vitals:   05/14/18 0724  TempSrc: Oral  PainSc: 6       Patients Stated Pain Goal: 8 (16/10/96 0454)  Complications: No apparent anesthesia complications

## 2018-05-14 NOTE — Anesthesia Postprocedure Evaluation (Signed)
Anesthesia Post Note  Patient: Peter Lewis  Procedure(s) Performed: RECURRENT HERNIA REPAIR INGUINAL ADULT WITH MESH (Right Groin)  Patient location during evaluation: Short Stay Anesthesia Type: General Level of consciousness: awake and alert and patient cooperative Pain management: satisfactory to patient Vital Signs Assessment: post-procedure vital signs reviewed and stable Respiratory status: spontaneous breathing Cardiovascular status: stable Postop Assessment: no apparent nausea or vomiting Anesthetic complications: no     Last Vitals:  Vitals:   05/14/18 1000 05/14/18 1011  BP: (!) 151/95 (!) 158/76  Pulse: (!) 56 (!) 54  Resp: 20 18  Temp:  36.6 C  SpO2: 99% 98%    Last Pain:  Vitals:   05/14/18 1011  TempSrc: Oral  PainSc: 5                  [name]

## 2018-05-14 NOTE — Op Note (Signed)
Patient:  Peter Lewis  DOB:  [birth year]  MRN:  [id number]   Preop Diagnosis: Recurrent right inguinal hernia  Postop Diagnosis: Same  Procedure: Recurrent right inguinal herniorrhaphy with mesh  Surgeon: Aviva Signs, MD  Anes: General  Indications: Patient is a 62 year old black male who presents with a recurrent right inguinal hernia.  The risks and benefits of the procedure including bleeding, infection, mesh use, and the possibility of recurrence of the hernia were fully explained to the patient, who gave informed consent.  Procedure note: The patient was placed in supine position.  After general anesthesia was administered, the right groin region was prepped and draped using usual sterile technique with Betadine.  Surgical site confirmation was performed.  An incision was made in the right groin region down to the external Bleich aponeurosis.  The aponeurosis was incised to the external ring.  The patient was noted to have a large direct hernia sac.  There was significant scarring from his previous surgery.  No mesh was found.  The spermatic cord was scarred down towards the shelving edge of Poupart's ligament.  I did not freed this away so that I would not compromise any vasculature.  The direct hernia was incised at its base and inverted.  A large Bard PerFix plug was then inserted.  It was secured circumferentially to the transversalis fascia and conjoined tendon using 2-0 Novafil interrupted sutures.  A lipoma of the cord was also found and this was ligated high along the peritoneal reflection with a 3-0 Vicryl tie.  The lipoma was excised and disposed of.  The external Bleich aponeurosis was reapproximated using a 2-0 Vicryl running suture.  The subcutaneous layer was reapproximated using 3-0 Vicryl interrupted suture.  Exparel was instilled into the surrounding wound.  The skin was closed using a 4-0 Monocryl subcuticular suture.  Dermabond was applied.  All tape and needle counts  were correct at the end of the procedure.  The patient was awakened and transferred to PACU in stable condition.  Complications: None  EBL: Minimal  Specimen: None

## 2018-05-14 NOTE — Interval H&P Note (Signed)
History and Physical Interval Note:  05/14/2018 7:47 AM  Peter Lewis  has presented today for surgery, with the diagnosis of right inguinal hernia  The various methods of treatment have been discussed with the patient and family. After consideration of risks, benefits and other options for treatment, the patient has consented to  Procedure(s): HERNIA REPAIR INGUINAL ADULT WITH MESH (Right) as a surgical intervention .  The patient's history has been reviewed, patient examined, no change in status, stable for surgery.  I have reviewed the patient's chart and labs.  Questions were answered to the patient's satisfaction.     Aviva Signs

## 2018-05-14 NOTE — Anesthesia Procedure Notes (Addendum)
Procedure Name: LMA Insertion Date/Time: 05/14/2018 8:19 AM Performed by: Vista Deck, CRNA Pre-anesthesia Checklist: Patient identified, Patient being monitored, Emergency Drugs available, Timeout performed and Suction available Patient Re-evaluated:Patient Re-evaluated prior to induction Oxygen Delivery Method: Circle System Utilized Preoxygenation: Pre-oxygenation with 100% oxygen Induction Type: IV induction Ventilation: Mask ventilation without difficulty LMA: LMA inserted LMA Size: 3.0 Laryngoscope Size: Mac and 4 Grade View: Grade II Number of attempts: 1 Placement Confirmation: positive ETCO2 and breath sounds checked- equal and bilateral Tube secured with: Tape Dental Injury: Teeth and Oropharynx as per pre-operative assessment

## 2018-05-14 NOTE — Anesthesia Preprocedure Evaluation (Signed)
Anesthesia Evaluation  Patient identified by MRN, date of birth, ID band Patient awake    Reviewed: Allergy & Precautions, NPO status , Patient's Chart, lab work & pertinent test results  Airway Mallampati: II  TM Distance: >3 FB Neck ROM: Full    Dental no notable dental hx. (+) Teeth Intact   Pulmonary neg pulmonary ROS, sleep apnea ,  States had OSA lost >20 lbs - now no Tx   Pulmonary exam normal breath sounds clear to auscultation       Cardiovascular Exercise Tolerance: Good + Past MI  negative cardio ROS Normal cardiovascular examI Rhythm:Regular Rate:Normal  States light MI ~2000  Denies any recent CP/DOE States good ET Never sees cardiology - no interventions done -denies stents   Neuro/Psych negative neurological ROS  negative psych ROS   GI/Hepatic negative GI ROS, Neg liver ROS, GERD  Medicated and Controlled,  Endo/Other  negative endocrine ROSdiabetes, Well Controlled, Type 2, Oral Hypoglycemic Agents  Renal/GU negative Renal ROS  negative genitourinary   Musculoskeletal negative musculoskeletal ROS (+) Arthritis , Osteoarthritis,    Abdominal   Peds negative pediatric ROS (+)  Hematology negative hematology ROS (+)   Anesthesia Other Findings   Reproductive/Obstetrics negative OB ROS                             Anesthesia Physical Anesthesia Plan  ASA: II  Anesthesia Plan: General   Post-op Pain Management:    Induction: Intravenous  PONV Risk Score and Plan:   Airway Management Planned:   Additional Equipment:   Intra-op Plan:   Post-operative Plan: Extubation in OR  Informed Consent: I have reviewed the patients History and Physical, chart, labs and discussed the procedure including the risks, benefits and alternatives for the proposed anesthesia with the patient or authorized representative who has indicated his/her understanding and acceptance.      Plan Discussed with: CRNA  Anesthesia Plan Comments: (LMA vs GETA)        Anesthesia Quick Evaluation

## 2018-05-25 ENCOUNTER — Encounter: Payer: Self-pay | Admitting: General Surgery

## 2018-05-25 ENCOUNTER — Ambulatory Visit (INDEPENDENT_AMBULATORY_CARE_PROVIDER_SITE_OTHER): Payer: Self-pay | Admitting: General Surgery

## 2018-05-25 ENCOUNTER — Encounter (INDEPENDENT_AMBULATORY_CARE_PROVIDER_SITE_OTHER): Payer: Self-pay

## 2018-05-25 VITALS — BP 157/83 | HR 60 | Temp 97.5°F | Resp 18 | Wt 226.0 lb

## 2018-05-25 DIAGNOSIS — Z09 Encounter for follow-up examination after completed treatment for conditions other than malignant neoplasm: Secondary | ICD-10-CM

## 2018-05-25 NOTE — Progress Notes (Signed)
Subjective:     Peter Lewis  Status post recurrent right inguinal herniorrhaphy with mesh.  Doing well.  Still has some swelling in the scrotal region.  Denies any significant incisional pain. Objective:    BP (!) 157/83 (BP Location: Left Arm, Patient Position: Sitting, Cuff Size: Normal)   Pulse 60   Temp (!) 97.5 F (36.4 C) (Temporal)   Resp 18   Wt 226 lb (102.5 kg)   BMI 36.48 kg/m   General:  alert, cooperative and no distress  Inguinal incision healing well.  Moderate swelling in the scrotum.  No significant swelling along spermatic cord.     Assessment:    Doing well postoperatively.    Plan:   Avoid lifting anything heavy over 20 pounds.  We will follow-up here in 3 weeks.

## 2018-06-15 ENCOUNTER — Ambulatory Visit (INDEPENDENT_AMBULATORY_CARE_PROVIDER_SITE_OTHER): Payer: Self-pay | Admitting: General Surgery

## 2018-06-15 ENCOUNTER — Encounter: Payer: Self-pay | Admitting: General Surgery

## 2018-06-15 VITALS — BP 144/71 | HR 63 | Temp 97.8°F | Resp 18 | Wt 228.0 lb

## 2018-06-15 DIAGNOSIS — Z09 Encounter for follow-up examination after completed treatment for conditions other than malignant neoplasm: Secondary | ICD-10-CM

## 2018-06-15 NOTE — Progress Notes (Signed)
Subjective:     Peter Lewis  Doing well.  Has minimal limitation of motion. Objective:    BP (!) 144/71 (BP Location: Left Arm, Patient Position: Sitting, Cuff Size: Large)   Pulse 63   Temp 97.8 F (36.6 C) (Temporal)   Resp 18   Wt 228 lb (103.4 kg)   BMI 36.80 kg/m   General:  alert, cooperative and no distress  Right inguinal incision healing well.     Assessment:    Doing well postoperatively.    Plan:   Gradually increase activity and lifting of heavy material.  May return to work on 07/12/2018 without restrictions.  Follow-up here as needed.

## 2019-08-09 DIAGNOSIS — K4090 Unilateral inguinal hernia, without obstruction or gangrene, not specified as recurrent: Secondary | ICD-10-CM | POA: Insufficient documentation

## 2019-08-09 DIAGNOSIS — K5900 Constipation, unspecified: Secondary | ICD-10-CM | POA: Insufficient documentation

## 2020-02-24 DIAGNOSIS — Z23 Encounter for immunization: Secondary | ICD-10-CM | POA: Diagnosis not present

## 2020-02-29 DIAGNOSIS — M545 Low back pain: Secondary | ICD-10-CM | POA: Diagnosis not present

## 2020-02-29 DIAGNOSIS — Z0189 Encounter for other specified special examinations: Secondary | ICD-10-CM | POA: Diagnosis not present

## 2020-02-29 DIAGNOSIS — E1165 Type 2 diabetes mellitus with hyperglycemia: Secondary | ICD-10-CM | POA: Diagnosis not present

## 2020-02-29 DIAGNOSIS — R600 Localized edema: Secondary | ICD-10-CM | POA: Diagnosis not present

## 2020-02-29 DIAGNOSIS — I10 Essential (primary) hypertension: Secondary | ICD-10-CM | POA: Diagnosis not present

## 2020-03-05 ENCOUNTER — Other Ambulatory Visit (HOSPITAL_COMMUNITY): Payer: Self-pay | Admitting: Internal Medicine

## 2020-03-05 DIAGNOSIS — N644 Mastodynia: Secondary | ICD-10-CM

## 2020-03-05 DIAGNOSIS — R609 Edema, unspecified: Secondary | ICD-10-CM

## 2020-03-28 DIAGNOSIS — E1165 Type 2 diabetes mellitus with hyperglycemia: Secondary | ICD-10-CM | POA: Diagnosis not present

## 2020-03-28 DIAGNOSIS — I10 Essential (primary) hypertension: Secondary | ICD-10-CM | POA: Diagnosis not present

## 2020-04-04 DIAGNOSIS — Z0001 Encounter for general adult medical examination with abnormal findings: Secondary | ICD-10-CM | POA: Diagnosis not present

## 2020-04-04 DIAGNOSIS — R600 Localized edema: Secondary | ICD-10-CM | POA: Diagnosis not present

## 2020-06-10 ENCOUNTER — Other Ambulatory Visit: Payer: Self-pay

## 2020-06-10 ENCOUNTER — Emergency Department (HOSPITAL_COMMUNITY)
Admission: EM | Admit: 2020-06-10 | Discharge: 2020-06-10 | Disposition: A | Discharge status: Home / Self Care | Payer: Medicare Other | Attending: Emergency Medicine | Admitting: Emergency Medicine

## 2020-06-10 DIAGNOSIS — Z7982 Long term (current) use of aspirin: Secondary | ICD-10-CM | POA: Diagnosis not present

## 2020-06-10 DIAGNOSIS — M545 Low back pain, unspecified: Secondary | ICD-10-CM

## 2020-06-10 DIAGNOSIS — E119 Type 2 diabetes mellitus without complications: Secondary | ICD-10-CM | POA: Diagnosis not present

## 2020-06-10 DIAGNOSIS — R109 Unspecified abdominal pain: Secondary | ICD-10-CM | POA: Diagnosis present

## 2020-06-10 LAB — URINALYSIS, ROUTINE W REFLEX MICROSCOPIC
Bilirubin Urine: NEGATIVE
Glucose, UA: NEGATIVE mg/dL
Hgb urine dipstick: NEGATIVE
Ketones, ur: NEGATIVE mg/dL
Nitrite: NEGATIVE
Protein, ur: 30 mg/dL — AB
Specific Gravity, Urine: 1.028 (ref 1.005–1.030)
pH: 5 (ref 5.0–8.0)

## 2020-06-10 MED ORDER — METHOCARBAMOL 500 MG PO TABS
500.0000 mg | ORAL_TABLET | Freq: Three times a day (TID) | ORAL | 0 refills | Status: DC | PRN
Start: 2020-06-10 — End: 2023-09-28

## 2020-06-10 NOTE — Discharge Instructions (Signed)
Take the muscle relaxers as needed.  Urine culture has been sent and you will be notified if it is positive.

## 2020-06-10 NOTE — ED Provider Notes (Signed)
[facility] EMERGENCY DEPARTMENT Provider Note   CSN: [id number] Arrival date & time: 06/10/20  5631     History Chief Complaint  Patient presents with  . Flank Pain    Peter Lewis is a 64 y.o. male.  HPI Patient presents with low back/flank pain.  States started on left side but now on right side.  Has had for the last day or 2.  No urinary symptoms.  No frequency.  No dysuria.  States he has had a urinary tract fracture in the past.  States he does lift heavy things at work and has been doing that although there was not a specific injury.  Pain is worse with specific movements.  No abdominal pain.  No numbness weakness.  No confusion.  Not on anticoagulation.  No cancer history.  No rash.  No fevers.    Past Medical History:  Diagnosis Date  . Arthritis   . Diabetes mellitus without complication ([place])   . GERD (gastroesophageal reflux disease)   . Sleep apnea     Patient Active Problem List   Diagnosis Date Noted  . Unilateral recurrent inguinal hernia without obstruction or gangrene     Past Surgical History:  Procedure Laterality Date  . HERNIA REPAIR     Umbilical as a teenager  . INGUINAL HERNIA REPAIR Right 05/14/2018   Procedure: RECURRENT HERNIA REPAIR INGUINAL ADULT WITH MESH;  Surgeon: Aviva Signs, MD;  Location: [facility];  Service: General;  Laterality: Right;  . WRIST FRACTURE SURGERY Right        No family history on file.  Social History   Tobacco Use  . Smoking status: Never Smoker  . Smokeless tobacco: Never Used  Vaping Use  . Vaping Use: Never used  Substance Use Topics  . Alcohol use: Yes    Comment: weekends  . Drug use: No    Home Medications Prior to Admission medications   Medication Sig Start Date End Date Taking? Authorizing Provider  acetaminophen (TYLENOL) 650 MG CR tablet Take 1,300 mg by mouth every 8 (eight) hours as needed for pain.    [provider]  aspirin EC 81 MG tablet Take 81 mg by mouth daily.     [provider]  cyclobenzaprine (FLEXERIL) 10 MG tablet Take 10 mg by mouth daily as needed for muscle spasms.     [provider]  ibuprofen (ADVIL,MOTRIN) 200 MG tablet Take 800 mg by mouth every 6 (six) hours as needed for moderate pain.     [provider]  metFORMIN (GLUCOPHAGE) 500 MG tablet Take 500 mg by mouth 3 (three) times daily after meals.    [provider]  methocarbamol (ROBAXIN) 500 MG tablet Take 1 tablet (500 mg total) by mouth every 8 (eight) hours as needed for muscle spasms. 06/10/20   Davonna Belling, MD  naproxen (NAPROSYN) 500 MG tablet Take 1 tablet (500 mg total) by mouth 2 (two) times daily. Patient taking differently: Take 500 mg by mouth daily as needed for moderate pain.  04/11/18   Dorie Rank, MD    Allergies    Patient has no known allergies.  Review of Systems   Review of Systems  Constitutional: Negative for appetite change.  HENT: Negative for congestion.   Respiratory: Negative for shortness of breath.   Cardiovascular: Negative for chest pain.  Gastrointestinal: Negative for abdominal pain.  Genitourinary: Positive for flank pain.  Musculoskeletal: Positive for back pain.  Skin: Negative for rash.  Neurological:  Negative for weakness.  Psychiatric/Behavioral: Negative for confusion.    Physical Exam Updated Vital Signs BP (!) 151/85 (BP Location: Right Arm)   Pulse 67   Temp 98.4 F (36.9 C) (Oral)   Resp 18   Ht 5\' 6"  (1.676 m)   Wt 111.6 kg   SpO2 96%   BMI 39.71 kg/m   Physical Exam Vitals and nursing note reviewed.  HENT:     Head: Atraumatic.  Pulmonary:     Effort: No respiratory distress.     Breath sounds: No rhonchi.  Abdominal:     Palpations: There is no mass.     Tenderness: There is no abdominal tenderness.  Musculoskeletal:     Cervical back: Neck supple.     Comments: Tenderness in musculature of lower back bilaterally.  No midline tenderness.  No clear CVA tenderness.   Skin:    General: Skin is warm.  Neurological:     Mental Status: He is alert and oriented to person, place, and time.     ED Results / Procedures / Treatments   Labs (all labs ordered are listed, but only abnormal results are displayed) Labs Reviewed  URINALYSIS, ROUTINE W REFLEX MICROSCOPIC - Abnormal; Notable for the following components:      Result Value   APPearance HAZY (*)    Protein, ur 30 (*)    Leukocytes,Ua SMALL (*)    Bacteria, UA FEW (*)    All other components within normal limits  URINE CULTURE    EKG None  Radiology No results found.  Procedures Procedures (including critical care time)  Medications Ordered in ED Medications - No data to display  ED Course  I have reviewed the triage vital signs and the nursing notes.  Pertinent labs & imaging results that were available during my care of the patient were reviewed by me and considered in my medical decision making (see chart for details).    MDM Rules/Calculators/A&P                          Patient with bilateral low back/flank pain.  I think most likely musculoskeletal.  Worse with movements.  Does lift heavy things at work.  Has no fevers.  No chills.  No dysuria.  Although has had previous urinary tract infections.  Urine has some nonspecific findings with small leukocytes and few bacteria.  Culture sent but will not empirically treat.  However if culture does show growth may be worth treatment at that time.  Will treat symptomatically for back pain with outpatient follow-up as needed Final Clinical Impression(s) / ED Diagnoses Final diagnoses:  Acute bilateral low back pain without sciatica    Rx / DC Orders ED Discharge Orders         Ordered    methocarbamol (ROBAXIN) 500 MG tablet  Every 8 hours PRN     Discontinue  Reprint     06/10/20 1042           Davonna Belling, MD 06/10/20 1042

## 2020-06-10 NOTE — ED Triage Notes (Signed)
Pt c/o bilateral flank pain x one day; pt denies any urinary sx and states he has been taking OTC AZO

## 2020-06-12 LAB — URINE CULTURE: Culture: <10000 — AB

## 2020-06-25 DIAGNOSIS — M79606 Pain in leg, unspecified: Secondary | ICD-10-CM | POA: Diagnosis not present

## 2020-06-25 DIAGNOSIS — M5431 Sciatica, right side: Secondary | ICD-10-CM | POA: Diagnosis not present

## 2020-08-09 DIAGNOSIS — Z1159 Encounter for screening for other viral diseases: Secondary | ICD-10-CM | POA: Diagnosis not present

## 2020-08-10 DIAGNOSIS — E1165 Type 2 diabetes mellitus with hyperglycemia: Secondary | ICD-10-CM | POA: Diagnosis not present

## 2020-08-10 DIAGNOSIS — I10 Essential (primary) hypertension: Secondary | ICD-10-CM | POA: Diagnosis not present

## 2020-08-14 DIAGNOSIS — D122 Benign neoplasm of ascending colon: Secondary | ICD-10-CM | POA: Diagnosis not present

## 2020-08-14 DIAGNOSIS — D123 Benign neoplasm of transverse colon: Secondary | ICD-10-CM | POA: Diagnosis not present

## 2020-08-14 DIAGNOSIS — K5730 Diverticulosis of large intestine without perforation or abscess without bleeding: Secondary | ICD-10-CM | POA: Diagnosis not present

## 2020-08-14 DIAGNOSIS — K648 Other hemorrhoids: Secondary | ICD-10-CM | POA: Diagnosis not present

## 2020-08-14 DIAGNOSIS — Z1211 Encounter for screening for malignant neoplasm of colon: Secondary | ICD-10-CM | POA: Diagnosis not present

## 2021-03-18 ENCOUNTER — Other Ambulatory Visit: Payer: Self-pay | Admitting: Internal Medicine

## 2021-03-18 DIAGNOSIS — M7989 Other specified soft tissue disorders: Secondary | ICD-10-CM

## 2021-03-18 DIAGNOSIS — M79605 Pain in left leg: Secondary | ICD-10-CM

## 2021-03-19 ENCOUNTER — Other Ambulatory Visit: Payer: Self-pay

## 2021-03-19 ENCOUNTER — Ambulatory Visit (HOSPITAL_COMMUNITY)
Admission: RE | Admit: 2021-03-19 | Discharge: 2021-03-19 | Disposition: A | Discharge status: Home / Self Care | Payer: Medicare Other | Source: Ambulatory Visit | Attending: Internal Medicine | Admitting: Internal Medicine

## 2021-03-19 DIAGNOSIS — M79605 Pain in left leg: Secondary | ICD-10-CM | POA: Diagnosis not present

## 2021-03-19 DIAGNOSIS — M7989 Other specified soft tissue disorders: Secondary | ICD-10-CM | POA: Diagnosis present

## 2021-05-09 ENCOUNTER — Other Ambulatory Visit: Payer: Self-pay | Admitting: *Deleted

## 2021-05-09 DIAGNOSIS — I83893 Varicose veins of bilateral lower extremities with other complications: Secondary | ICD-10-CM

## 2021-05-28 ENCOUNTER — Encounter (HOSPITAL_COMMUNITY): Payer: Medicare Other

## 2021-06-26 ENCOUNTER — Encounter (HOSPITAL_COMMUNITY): Payer: Medicare Other

## 2021-06-26 DIAGNOSIS — Z23 Encounter for immunization: Secondary | ICD-10-CM | POA: Insufficient documentation

## 2021-06-26 DIAGNOSIS — M542 Cervicalgia: Secondary | ICD-10-CM | POA: Insufficient documentation

## 2021-06-26 DIAGNOSIS — N3281 Overactive bladder: Secondary | ICD-10-CM | POA: Insufficient documentation

## 2021-06-26 DIAGNOSIS — R030 Elevated blood-pressure reading, without diagnosis of hypertension: Secondary | ICD-10-CM | POA: Insufficient documentation

## 2021-06-26 DIAGNOSIS — T40711A Poisoning by cannabis, accidental (unintentional), initial encounter: Secondary | ICD-10-CM | POA: Insufficient documentation

## 2021-06-26 DIAGNOSIS — K219 Gastro-esophageal reflux disease without esophagitis: Secondary | ICD-10-CM | POA: Insufficient documentation

## 2021-06-26 DIAGNOSIS — F319 Bipolar disorder, unspecified: Secondary | ICD-10-CM | POA: Insufficient documentation

## 2021-06-26 DIAGNOSIS — F32A Depression, unspecified: Secondary | ICD-10-CM | POA: Insufficient documentation

## 2021-06-26 DIAGNOSIS — I10 Essential (primary) hypertension: Secondary | ICD-10-CM | POA: Insufficient documentation

## 2021-06-26 DIAGNOSIS — M549 Dorsalgia, unspecified: Secondary | ICD-10-CM | POA: Insufficient documentation

## 2021-06-26 DIAGNOSIS — M2550 Pain in unspecified joint: Secondary | ICD-10-CM | POA: Insufficient documentation

## 2021-06-26 DIAGNOSIS — E782 Mixed hyperlipidemia: Secondary | ICD-10-CM | POA: Insufficient documentation

## 2021-06-26 DIAGNOSIS — E65 Localized adiposity: Secondary | ICD-10-CM | POA: Insufficient documentation

## 2021-06-26 DIAGNOSIS — E119 Type 2 diabetes mellitus without complications: Secondary | ICD-10-CM | POA: Insufficient documentation

## 2021-06-26 DIAGNOSIS — M1990 Unspecified osteoarthritis, unspecified site: Secondary | ICD-10-CM | POA: Insufficient documentation

## 2021-06-26 DIAGNOSIS — G4730 Sleep apnea, unspecified: Secondary | ICD-10-CM | POA: Insufficient documentation

## 2021-06-26 DIAGNOSIS — F4310 Post-traumatic stress disorder, unspecified: Secondary | ICD-10-CM | POA: Insufficient documentation

## 2021-10-18 DIAGNOSIS — J069 Acute upper respiratory infection, unspecified: Secondary | ICD-10-CM | POA: Diagnosis not present

## 2021-12-18 DIAGNOSIS — E119 Type 2 diabetes mellitus without complications: Secondary | ICD-10-CM | POA: Diagnosis not present

## 2021-12-18 DIAGNOSIS — I10 Essential (primary) hypertension: Secondary | ICD-10-CM | POA: Diagnosis not present

## 2022-02-10 DIAGNOSIS — A084 Viral intestinal infection, unspecified: Secondary | ICD-10-CM | POA: Diagnosis not present

## 2022-02-10 DIAGNOSIS — E1165 Type 2 diabetes mellitus with hyperglycemia: Secondary | ICD-10-CM | POA: Diagnosis not present

## 2022-04-04 DIAGNOSIS — R7301 Impaired fasting glucose: Secondary | ICD-10-CM | POA: Diagnosis not present

## 2022-04-12 DIAGNOSIS — I10 Essential (primary) hypertension: Secondary | ICD-10-CM | POA: Diagnosis not present

## 2022-04-12 DIAGNOSIS — E1165 Type 2 diabetes mellitus with hyperglycemia: Secondary | ICD-10-CM | POA: Diagnosis not present

## 2022-07-01 DIAGNOSIS — K219 Gastro-esophageal reflux disease without esophagitis: Secondary | ICD-10-CM | POA: Diagnosis not present

## 2022-07-01 DIAGNOSIS — I10 Essential (primary) hypertension: Secondary | ICD-10-CM | POA: Diagnosis not present

## 2022-07-01 DIAGNOSIS — M79602 Pain in left arm: Secondary | ICD-10-CM | POA: Diagnosis not present

## 2022-07-28 DIAGNOSIS — E1165 Type 2 diabetes mellitus with hyperglycemia: Secondary | ICD-10-CM | POA: Diagnosis not present

## 2022-07-28 DIAGNOSIS — I10 Essential (primary) hypertension: Secondary | ICD-10-CM | POA: Diagnosis not present

## 2022-08-30 DIAGNOSIS — E785 Hyperlipidemia, unspecified: Secondary | ICD-10-CM | POA: Diagnosis not present

## 2022-08-30 DIAGNOSIS — I10 Essential (primary) hypertension: Secondary | ICD-10-CM | POA: Diagnosis not present

## 2022-08-30 DIAGNOSIS — E1165 Type 2 diabetes mellitus with hyperglycemia: Secondary | ICD-10-CM | POA: Diagnosis not present

## 2022-08-30 DIAGNOSIS — K219 Gastro-esophageal reflux disease without esophagitis: Secondary | ICD-10-CM | POA: Diagnosis not present

## 2022-08-30 DIAGNOSIS — Z0000 Encounter for general adult medical examination without abnormal findings: Secondary | ICD-10-CM | POA: Diagnosis not present

## 2022-11-02 IMAGING — US US EXTREM LOW VENOUS*L*
1 series · 13 of 24 positions shown · non-contrast
Comparison: None.

CLINICAL DATA: Lower extremity pain and edema

EXAM:
LEFT LOWER EXTREMITY VENOUS DUPLEX ULTRASOUND
TECHNIQUE: Gray-scale sonography with graded compression, as well as color
Doppler and duplex ultrasound were performed to evaluate the left
lower extremity deep venous system from the level of the common
femoral vein and including the common femoral, femoral, profunda
femoral, popliteal and calf veins including the posterior tibial,
peroneal and gastrocnemius veins when visible. The superficial great
saphenous vein was also interrogated. Spectral Doppler was utilized
to evaluate flow at rest and with distal augmentation maneuvers in
the common femoral, femoral and popliteal veins.

[Series 1: us venous img lower uni left (dvt) · portal-venous · 13 of 36 slices shown]
[im 1/36]
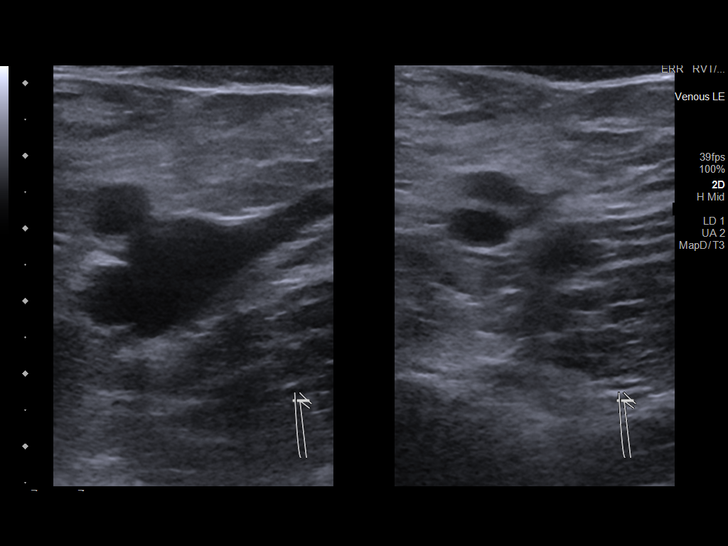
[im 4/36]
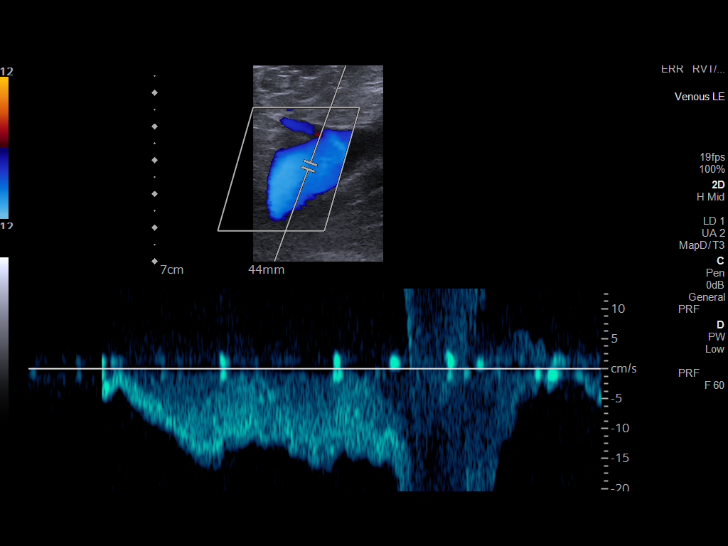
[im 7/36]
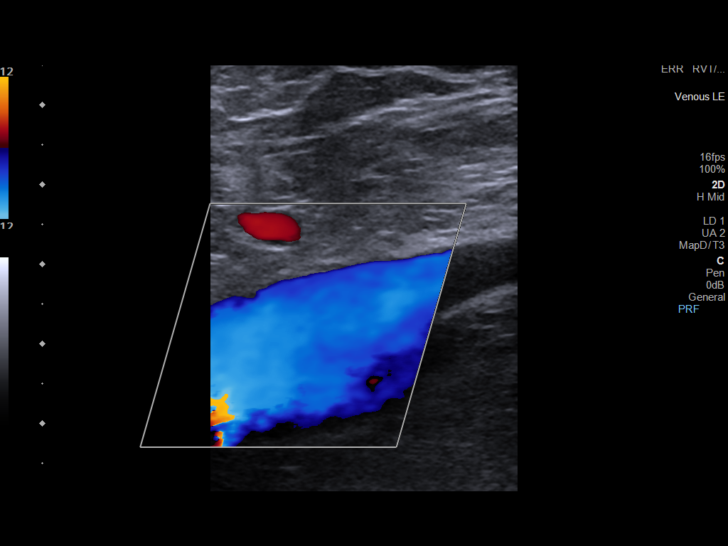
[im 10/36]
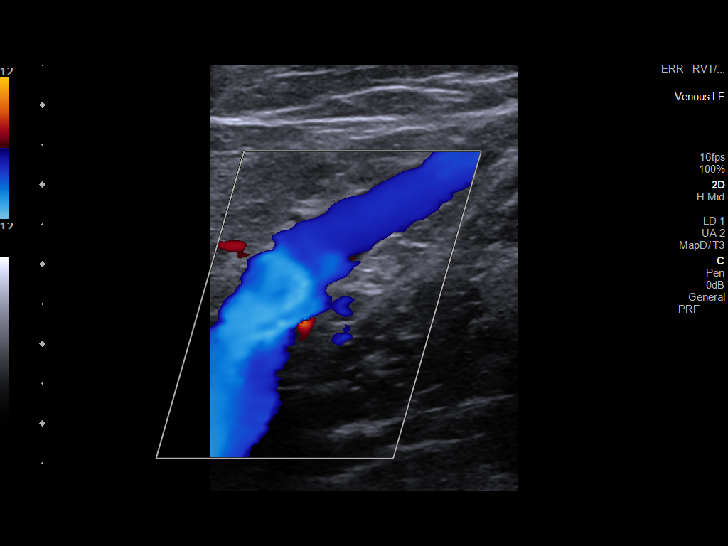
[im 13/36]
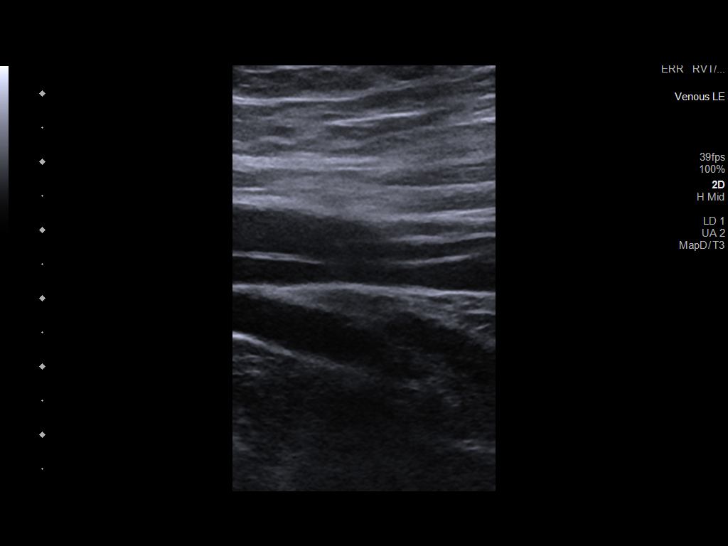
[im 16/36]
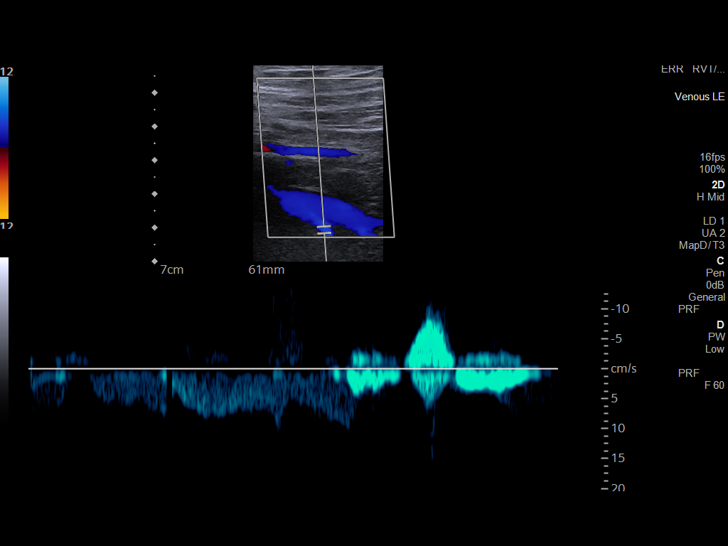
[im 19/36]
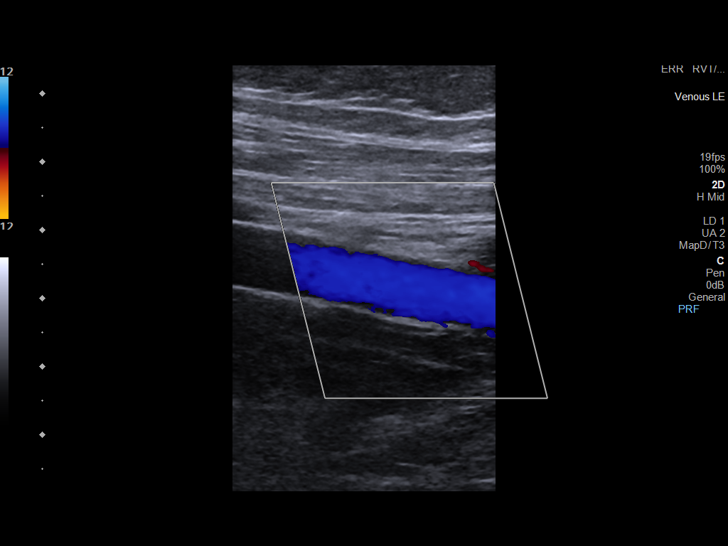
[im 20/36]
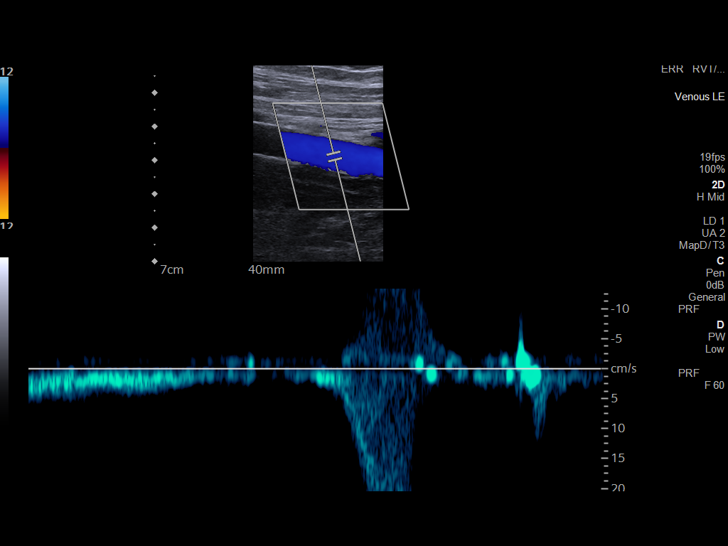
[im 23/36]
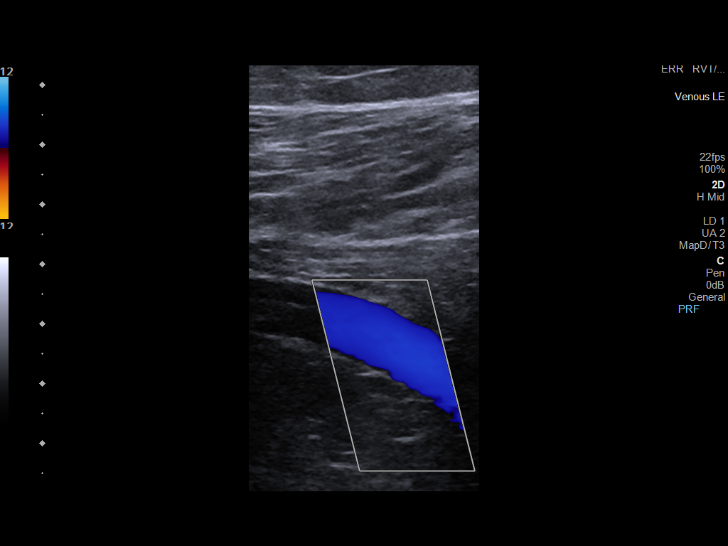
[im 26/36]
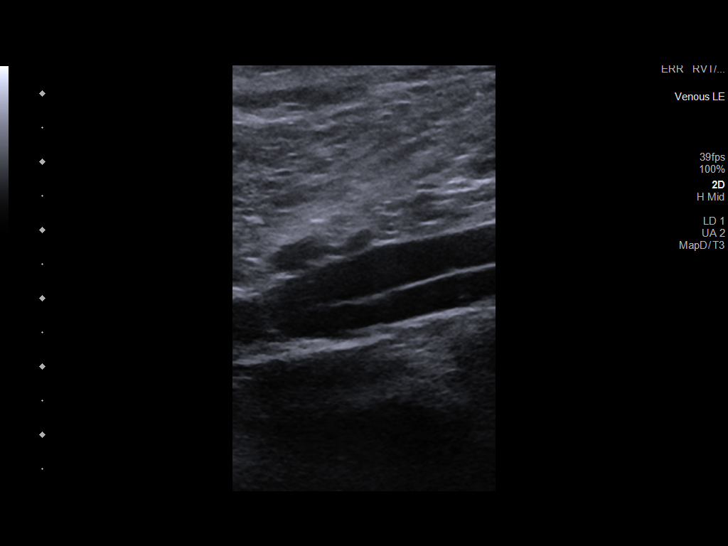
[im 29/36]
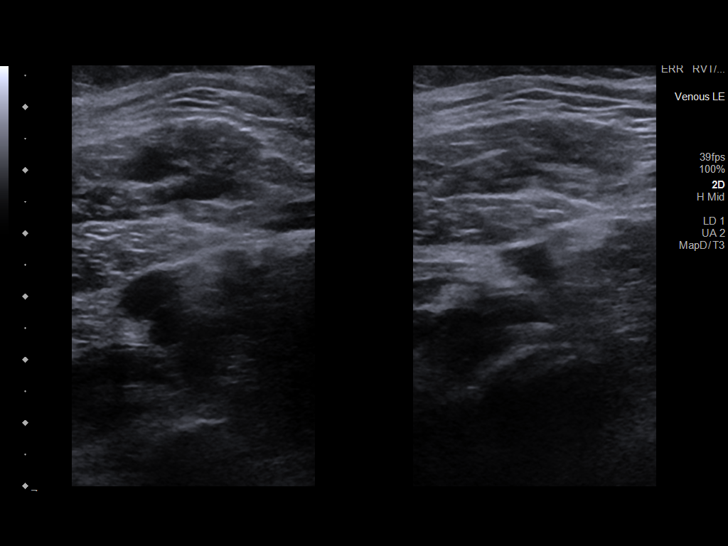
[im 32/36]
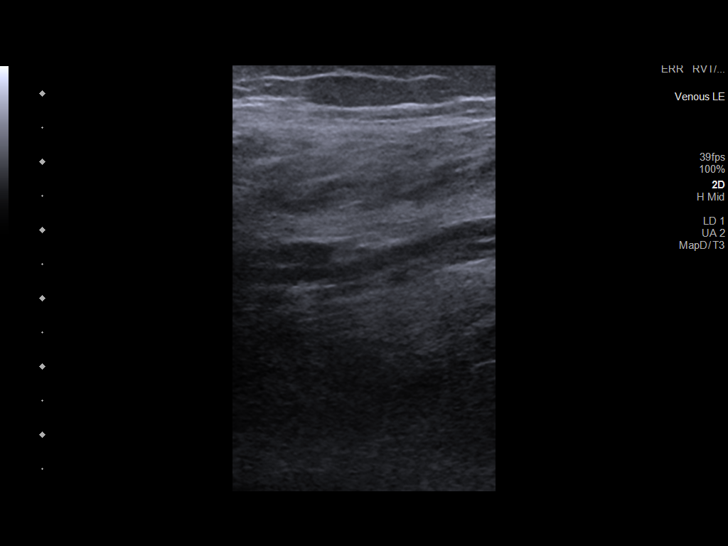
[im 36/36]
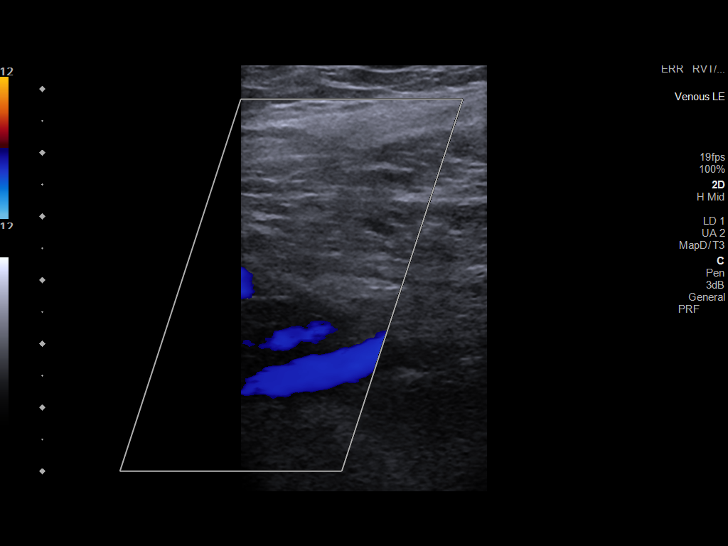

[13 of 24 positions shown; findings below may reference images not displayed]

FINDINGS: Contralateral Common Femoral Vein: Respiratory phasicity is normal
and symmetric with the symptomatic side. No evidence of thrombus.
Normal compressibility.

Common Femoral Vein: No evidence of thrombus. Normal
compressibility, respiratory phasicity and response to augmentation.

Saphenofemoral Junction: No evidence of thrombus. Normal
compressibility and flow on color Doppler imaging.

Profunda Femoral Vein: No evidence of thrombus. Normal
compressibility and flow on color Doppler imaging.

Femoral Vein: No evidence of thrombus. Normal compressibility,
respiratory phasicity and response to augmentation.

Popliteal Vein: No evidence of thrombus. Normal compressibility,
respiratory phasicity and response to augmentation.

Calf Veins: No evidence of thrombus. Normal compressibility and flow
on color Doppler imaging.

Superficial Great Saphenous Vein: No evidence of thrombus. Normal
compressibility.

Venous Reflux:  None.

Other Findings:  None.
IMPRESSION: No evidence of deep venous thrombosis in the left lower extremity.
Right common femoral vein also patent.

## 2022-12-19 DIAGNOSIS — S39012A Strain of muscle, fascia and tendon of lower back, initial encounter: Secondary | ICD-10-CM | POA: Diagnosis not present

## 2022-12-19 DIAGNOSIS — X500XXA Overexertion from strenuous movement or load, initial encounter: Secondary | ICD-10-CM | POA: Diagnosis not present

## 2023-01-19 DIAGNOSIS — G8929 Other chronic pain: Secondary | ICD-10-CM | POA: Diagnosis not present

## 2023-01-19 DIAGNOSIS — S39012D Strain of muscle, fascia and tendon of lower back, subsequent encounter: Secondary | ICD-10-CM | POA: Diagnosis not present

## 2023-01-19 DIAGNOSIS — S39012A Strain of muscle, fascia and tendon of lower back, initial encounter: Secondary | ICD-10-CM | POA: Diagnosis not present

## 2023-01-19 DIAGNOSIS — M545 Low back pain, unspecified: Secondary | ICD-10-CM | POA: Diagnosis not present

## 2023-02-05 DIAGNOSIS — R051 Acute cough: Secondary | ICD-10-CM | POA: Diagnosis not present

## 2023-02-05 DIAGNOSIS — R059 Cough, unspecified: Secondary | ICD-10-CM | POA: Diagnosis not present

## 2023-02-05 DIAGNOSIS — J019 Acute sinusitis, unspecified: Secondary | ICD-10-CM | POA: Diagnosis not present

## 2023-03-19 DIAGNOSIS — I10 Essential (primary) hypertension: Secondary | ICD-10-CM | POA: Diagnosis not present

## 2023-03-19 DIAGNOSIS — E1165 Type 2 diabetes mellitus with hyperglycemia: Secondary | ICD-10-CM | POA: Diagnosis not present

## 2023-03-26 DIAGNOSIS — Z713 Dietary counseling and surveillance: Secondary | ICD-10-CM | POA: Diagnosis not present

## 2023-03-26 DIAGNOSIS — G8929 Other chronic pain: Secondary | ICD-10-CM | POA: Diagnosis not present

## 2023-03-26 DIAGNOSIS — K219 Gastro-esophageal reflux disease without esophagitis: Secondary | ICD-10-CM | POA: Diagnosis not present

## 2023-03-26 DIAGNOSIS — M545 Low back pain, unspecified: Secondary | ICD-10-CM | POA: Diagnosis not present

## 2023-03-26 DIAGNOSIS — I10 Essential (primary) hypertension: Secondary | ICD-10-CM | POA: Diagnosis not present

## 2023-03-26 DIAGNOSIS — E785 Hyperlipidemia, unspecified: Secondary | ICD-10-CM | POA: Diagnosis not present

## 2023-03-26 DIAGNOSIS — S39012A Strain of muscle, fascia and tendon of lower back, initial encounter: Secondary | ICD-10-CM | POA: Diagnosis not present

## 2023-03-26 DIAGNOSIS — E1165 Type 2 diabetes mellitus with hyperglycemia: Secondary | ICD-10-CM | POA: Diagnosis not present

## 2023-04-24 DIAGNOSIS — R319 Hematuria, unspecified: Secondary | ICD-10-CM | POA: Diagnosis not present

## 2023-04-24 DIAGNOSIS — J4520 Mild intermittent asthma, uncomplicated: Secondary | ICD-10-CM | POA: Diagnosis not present

## 2023-04-24 DIAGNOSIS — R52 Pain, unspecified: Secondary | ICD-10-CM | POA: Diagnosis not present

## 2023-06-22 DIAGNOSIS — Z7951 Long term (current) use of inhaled steroids: Secondary | ICD-10-CM | POA: Diagnosis not present

## 2023-06-22 DIAGNOSIS — J069 Acute upper respiratory infection, unspecified: Secondary | ICD-10-CM | POA: Diagnosis not present

## 2023-06-22 DIAGNOSIS — R062 Wheezing: Secondary | ICD-10-CM | POA: Diagnosis not present

## 2023-07-14 DIAGNOSIS — N528 Other male erectile dysfunction: Secondary | ICD-10-CM | POA: Insufficient documentation

## 2023-07-14 DIAGNOSIS — Z87442 Personal history of urinary calculi: Secondary | ICD-10-CM | POA: Insufficient documentation

## 2023-07-14 DIAGNOSIS — K4090 Unilateral inguinal hernia, without obstruction or gangrene, not specified as recurrent: Secondary | ICD-10-CM | POA: Diagnosis not present

## 2023-07-14 DIAGNOSIS — K4091 Unilateral inguinal hernia, without obstruction or gangrene, recurrent: Secondary | ICD-10-CM | POA: Diagnosis not present

## 2023-07-20 DIAGNOSIS — K4091 Unilateral inguinal hernia, without obstruction or gangrene, recurrent: Secondary | ICD-10-CM | POA: Diagnosis not present

## 2023-07-20 DIAGNOSIS — Z87442 Personal history of urinary calculi: Secondary | ICD-10-CM | POA: Diagnosis not present

## 2023-07-21 DIAGNOSIS — U071 COVID-19: Secondary | ICD-10-CM | POA: Diagnosis not present

## 2023-07-21 DIAGNOSIS — J309 Allergic rhinitis, unspecified: Secondary | ICD-10-CM | POA: Insufficient documentation

## 2023-08-28 DIAGNOSIS — J069 Acute upper respiratory infection, unspecified: Secondary | ICD-10-CM | POA: Diagnosis not present

## 2023-08-28 DIAGNOSIS — R0600 Dyspnea, unspecified: Secondary | ICD-10-CM | POA: Diagnosis not present

## 2023-09-23 ENCOUNTER — Ambulatory Visit (HOSPITAL_COMMUNITY)
Admission: RE | Admit: 2023-09-23 | Discharge: 2023-09-23 | Disposition: A | Discharge status: Home / Self Care | Payer: Medicare Other | Source: Ambulatory Visit | Attending: Family Medicine | Admitting: Family Medicine

## 2023-09-23 ENCOUNTER — Other Ambulatory Visit (HOSPITAL_COMMUNITY): Payer: Self-pay | Admitting: Family Medicine

## 2023-09-23 DIAGNOSIS — R0600 Dyspnea, unspecified: Secondary | ICD-10-CM

## 2023-09-23 DIAGNOSIS — J45909 Unspecified asthma, uncomplicated: Secondary | ICD-10-CM | POA: Diagnosis not present

## 2023-09-23 DIAGNOSIS — J4520 Mild intermittent asthma, uncomplicated: Secondary | ICD-10-CM | POA: Diagnosis not present

## 2023-09-23 DIAGNOSIS — R062 Wheezing: Secondary | ICD-10-CM | POA: Diagnosis not present

## 2023-09-25 DIAGNOSIS — E1165 Type 2 diabetes mellitus with hyperglycemia: Secondary | ICD-10-CM | POA: Diagnosis not present

## 2023-09-25 DIAGNOSIS — I10 Essential (primary) hypertension: Secondary | ICD-10-CM | POA: Diagnosis not present

## 2023-09-28 ENCOUNTER — Encounter (HOSPITAL_COMMUNITY): Payer: Self-pay

## 2023-09-28 ENCOUNTER — Emergency Department (HOSPITAL_COMMUNITY)
Admission: EM | Admit: 2023-09-28 | Discharge: 2023-09-28 | Disposition: A | Discharge status: Home / Self Care | Payer: Medicare Other | Attending: Emergency Medicine | Admitting: Emergency Medicine

## 2023-09-28 ENCOUNTER — Other Ambulatory Visit: Payer: Self-pay

## 2023-09-28 ENCOUNTER — Emergency Department (HOSPITAL_COMMUNITY): Payer: Medicare Other

## 2023-09-28 DIAGNOSIS — E876 Hypokalemia: Secondary | ICD-10-CM | POA: Diagnosis not present

## 2023-09-28 DIAGNOSIS — R0600 Dyspnea, unspecified: Secondary | ICD-10-CM | POA: Diagnosis present

## 2023-09-28 DIAGNOSIS — R0602 Shortness of breath: Secondary | ICD-10-CM | POA: Diagnosis not present

## 2023-09-28 DIAGNOSIS — Z7982 Long term (current) use of aspirin: Secondary | ICD-10-CM | POA: Insufficient documentation

## 2023-09-28 DIAGNOSIS — Z7984 Long term (current) use of oral hypoglycemic drugs: Secondary | ICD-10-CM | POA: Insufficient documentation

## 2023-09-28 DIAGNOSIS — R0789 Other chest pain: Secondary | ICD-10-CM | POA: Diagnosis not present

## 2023-09-28 DIAGNOSIS — J4521 Mild intermittent asthma with (acute) exacerbation: Secondary | ICD-10-CM | POA: Insufficient documentation

## 2023-09-28 DIAGNOSIS — I10 Essential (primary) hypertension: Secondary | ICD-10-CM | POA: Diagnosis not present

## 2023-09-28 DIAGNOSIS — J42 Unspecified chronic bronchitis: Secondary | ICD-10-CM | POA: Diagnosis not present

## 2023-09-28 DIAGNOSIS — R079 Chest pain, unspecified: Secondary | ICD-10-CM | POA: Diagnosis not present

## 2023-09-28 DIAGNOSIS — E119 Type 2 diabetes mellitus without complications: Secondary | ICD-10-CM | POA: Insufficient documentation

## 2023-09-28 HISTORY — DX: Unspecified asthma, uncomplicated: J45.909

## 2023-09-28 LAB — CBC WITH DIFFERENTIAL/PLATELET
Abs Immature Granulocytes: 0.03 10*3/uL (ref 0.00–0.07)
Basophils Absolute: 0 10*3/uL (ref 0.0–0.1)
Basophils Relative: 1 %
Eosinophils Absolute: 0.3 10*3/uL (ref 0.0–0.5)
Eosinophils Relative: 5 %
HCT: 42.5 % (ref 39.0–52.0)
Hemoglobin: 13 g/dL (ref 13.0–17.0)
Immature Granulocytes: 0 %
Lymphocytes Relative: 28 %
Lymphs Abs: 2 10*3/uL (ref 0.7–4.0)
MCH: 25 pg — ABNORMAL LOW (ref 26.0–34.0)
MCHC: 30.6 g/dL (ref 30.0–36.0)
MCV: 81.9 fL (ref 80.0–100.0)
Monocytes Absolute: 0.7 10*3/uL (ref 0.1–1.0)
Monocytes Relative: 10 %
Neutro Abs: 4 10*3/uL (ref 1.7–7.7)
Neutrophils Relative %: 56 %
Platelets: 257 10*3/uL (ref 150–400)
RBC: 5.19 MIL/uL (ref 4.22–5.81)
RDW: 14 % (ref 11.5–15.5)
WBC: 7 10*3/uL (ref 4.0–10.5)
nRBC: 0 % (ref 0.0–0.2)

## 2023-09-28 LAB — COMPREHENSIVE METABOLIC PANEL
ALT: 18 U/L (ref 0–44)
AST: 18 U/L (ref 15–41)
Albumin: 3.6 g/dL (ref 3.5–5.0)
Alkaline Phosphatase: 58 U/L (ref 38–126)
Anion gap: 8 (ref 5–15)
BUN: 11 mg/dL (ref 8–23)
CO2: 26 mmol/L (ref 22–32)
Calcium: 8.1 mg/dL — ABNORMAL LOW (ref 8.9–10.3)
Chloride: 99 mmol/L (ref 98–111)
Creatinine, Ser: 0.88 mg/dL (ref 0.61–1.24)
GFR, Estimated: >60 mL/min (ref >60)
Glucose, Bld: 216 mg/dL — ABNORMAL HIGH (ref 70–99)
Potassium: 3.4 mmol/L — ABNORMAL LOW (ref 3.5–5.1)
Sodium: 133 mmol/L — ABNORMAL LOW (ref 135–145)
Total Bilirubin: 0.7 mg/dL (ref 0.3–1.2)
Total Protein: 7.1 g/dL (ref 6.5–8.1)

## 2023-09-28 LAB — TROPONIN I (HIGH SENSITIVITY)
Troponin I (High Sensitivity): 4 ng/L (ref <18)
Troponin I (High Sensitivity): 5 ng/L (ref <18)

## 2023-09-28 LAB — BRAIN NATRIURETIC PEPTIDE: B Natriuretic Peptide: 50 pg/mL (ref 0.0–100.0)

## 2023-09-28 MED ORDER — POTASSIUM CHLORIDE CRYS ER 20 MEQ PO TBCR
40.0000 meq | EXTENDED_RELEASE_TABLET | Freq: Once | ORAL | Status: AC
  Administered 2023-09-28: 40 meq via ORAL
  Filled 2023-09-28: qty 2

## 2023-09-28 MED ORDER — ALBUTEROL SULFATE HFA 108 (90 BASE) MCG/ACT IN AERS: 1.0000 | INHALATION_SPRAY | RESPIRATORY_TRACT | Status: DC | PRN

## 2023-09-28 MED ORDER — METHYLPREDNISOLONE SODIUM SUCC 125 MG IJ SOLR
62.5000 mg | Freq: Once | INTRAMUSCULAR | Status: AC
  Administered 2023-09-28: 62.5 mg via INTRAVENOUS
  Filled 2023-09-28: qty 2

## 2023-09-28 MED ORDER — IPRATROPIUM-ALBUTEROL 0.5-2.5 (3) MG/3ML IN SOLN
3.0000 mL | Freq: Once | RESPIRATORY_TRACT | Status: AC
  Administered 2023-09-28: 3 mL via RESPIRATORY_TRACT
  Filled 2023-09-28: qty 3

## 2023-09-28 MED ORDER — ALBUTEROL SULFATE HFA 108 (90 BASE) MCG/ACT IN AERS: 1.0000 | INHALATION_SPRAY | RESPIRATORY_TRACT | 0 refills | Status: DC | PRN

## 2023-09-28 MED ORDER — PREDNISONE 10 MG (21) PO TBPK: ORAL_TABLET | Freq: Every day | ORAL | 0 refills | Status: DC

## 2023-09-28 NOTE — Discharge Instructions (Addendum)
It was a pleasure caring for you today in the emergency department.  Please return to the emergency department for any worsening or worrisome symptoms.

## 2023-09-28 NOTE — ED Provider Notes (Signed)
[place] EMERGENCY DEPARTMENT AT [facility] Provider Note  CSN: [id number] Arrival date & time: 09/28/23 5621  Chief Complaint(s) Chest Pain and Shortness of Breath  HPI HATTIE AGUINALDO is a 67 y.o. male with past medical history as below, significant for asthma, gerd, osa, DM, cannabis use, bipolar who presents to the ED with complaint of cp/dib  Reports intermittent dyspnea over the past 2 weeks, minimal relief with albuterol inhaler.  Reports that he was started on a trial inhaler inhaler recently which seem to be helping but ran out.  He is not sure the name of this medication. [facility] reports he lifted heavy object yesterday and began having chest pain.  Chest pain has subsided.  Still having ongoing dyspnea, at times worse when lying flat.  Worse with exertion.  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   Cervicalgia 06/26/2021   Elevated blood-pressure reading without diagnosis of hypertension 06/26/2021   Encounter for immunization 06/26/2021   Essential hypertension 06/26/2021   Gastroesophageal reflux disease 06/26/2021   Joint pain 06/26/2021   Mixed hyperlipidemia 06/26/2021   Post-traumatic stress disorder, unspecified 06/26/2021   Sleep apnea 06/26/2021   Type 2 or unspecified type diabetes mellitus 06/26/2021   Constipation 08/09/2019   Right inguinal hernia 08/09/2019   Unilateral recurrent inguinal hernia without obstruction or gangrene    Home Medication(s) Prior to Admission medications    Medication Sig Start Date End Date Taking? Authorizing Provider  albuterol (VENTOLIN HFA) 108 (90 Base) MCG/ACT inhaler Inhale 1-2 puffs into the lungs every 4 (four) hours as needed for wheezing or shortness of breath. 09/28/23  Yes Tanda Rockers A, DO  predniSONE (STERAPRED UNI-PAK 21 TAB) 10 MG (21) TBPK tablet Take by mouth daily. Take 6 tabs by mouth daily  for 2 days, then 5 tabs for 2 days, then 4 tabs for 2 days, then 3 tabs for 2 days, 2 tabs for 2 days, then 1 tab by mouth daily for 2 days 09/29/23  Yes Tanda Rockers A, DO  acetaminophen (TYLENOL) 650 MG CR tablet Take 1,300 mg by mouth every 8 (eight) hours as needed for pain.    [provider]  aspirin EC 81 MG tablet Take 81 mg by mouth daily.    [provider]  cyclobenzaprine (FLEXERIL) 10 MG tablet Take 10 mg by mouth daily as needed for muscle spasms.     [provider]  ibuprofen (ADVIL,MOTRIN) 200 MG tablet Take 800 mg by mouth every 6 (six) hours as needed for moderate pain.     [provider]  metFORMIN (GLUCOPHAGE) 500 MG tablet Take 500 mg by mouth 3 (three) times daily after meals.    [provider]  methocarbamol (ROBAXIN) 500 MG tablet Take 1 tablet (500 mg total) by mouth every 8 (eight) hours as needed for muscle spasms. 06/10/20   Benjiman Core, MD  naproxen (NAPROSYN) 500 MG tablet Take 1 tablet (500 mg total) by mouth 2 (two) times daily. Patient taking  differently: Take 500 mg by mouth daily as needed for moderate pain. 04/11/18   Linwood Dibbles, MD                                                                                                                                    Past Surgical History Past Surgical History:  Procedure Laterality Date   HERNIA REPAIR     Umbilical as a teenager   INGUINAL HERNIA REPAIR Right 05/14/2018   Procedure: RECURRENT HERNIA REPAIR INGUINAL ADULT WITH MESH;  Surgeon: Franky Macho, MD;  Location: [facility];  Service: General;   Laterality: Right;   WRIST FRACTURE SURGERY Right    Family History No family history on file.  Social History Social History   Tobacco Use   Smoking status: Never   Smokeless tobacco: Never  Vaping Use   Vaping status: Never Used  Substance Use Topics   Alcohol use: Yes    Comment: weekends   Drug use: No   Allergies Patient has no known allergies.  Review of Systems Review of Systems  Constitutional:  Negative for chills and fever.  Respiratory:  Positive for chest tightness and shortness of breath.   Cardiovascular:  Positive for chest pain.  Gastrointestinal:  Negative for abdominal pain and nausea.  All other systems reviewed and are negative.   Physical Exam Vital Signs  I have reviewed the triage vital signs BP (!) 158/81   Pulse (!) 55   Temp 97.9 F (36.6 C) (Oral)   Resp (!) 23   Ht 5\' 6"  (1.676 m)   Wt 108.9 kg   SpO2 95%   BMI 38.74 kg/m  Physical Exam Vitals and nursing note reviewed.  Constitutional:      General: He is not in acute distress.    Appearance: He is well-developed.  HENT:     Head: Normocephalic and atraumatic.     Right Ear: External ear normal.     Left Ear: External ear normal.     Mouth/Throat:     Mouth: Mucous membranes are moist.  Eyes:     General: No scleral icterus. Cardiovascular:     Rate and Rhythm: Normal rate and regular rhythm.     Pulses: Normal pulses.     Heart sounds: Normal heart sounds.  Pulmonary:     Effort: Pulmonary effort is normal. No respiratory distress.     Breath sounds: Normal breath sounds. No decreased breath sounds or wheezing.  Abdominal:     General: Abdomen is flat.     Palpations: Abdomen is soft.     Tenderness: There is no abdominal tenderness.  Musculoskeletal:     Cervical back: No rigidity.     Right lower leg: No edema.     Left lower leg: No edema.  Skin:    General: Skin is warm and dry.     Capillary Refill: Capillary refill takes less than 2  seconds.  Neurological:      Mental Status: He is alert and oriented to person, place, and time.     GCS: GCS eye subscore is 4. GCS verbal subscore is 5. GCS motor subscore is 6.  Psychiatric:        Mood and Affect: Mood normal.        Behavior: Behavior normal.     ED Results and Treatments Labs (all labs ordered are listed, but only abnormal results are displayed) Labs Reviewed  CBC WITH DIFFERENTIAL/PLATELET - Abnormal; Notable for the following components:      Result Value   MCH 25.0 (*)    All other components within normal limits  COMPREHENSIVE METABOLIC PANEL - Abnormal; Notable for the following components:   Sodium 133 (*)    Potassium 3.4 (*)    Glucose, Bld 216 (*)    Calcium 8.1 (*)    All other components within normal limits  BRAIN NATRIURETIC PEPTIDE  TROPONIN I (HIGH SENSITIVITY)  TROPONIN I (HIGH SENSITIVITY)                                                                                                                          Radiology DG Chest 2 View  Result Date: 09/28/2023 CLINICAL DATA:  Chest pain, shortness of breath EXAM: CHEST - 2 VIEW COMPARISON:  Prior chest x-ray 09/23/2023 FINDINGS: Stable cardiac and mediastinal contours. Similar degree of minimal pulmonary vascular congestion. No overt edema. No pneumothorax, pleural effusion or focal airspace infiltrate. Mild bronchitic changes are chronic and remain unchanged. IMPRESSION: Stable chest x-ray without evidence of acute cardiopulmonary process compared to recent prior imaging. Electronically Signed   By: Malachy Moan M.D.   On: 09/28/2023 08:37    Pertinent labs & imaging results that were available during my care of the patient were reviewed by me and considered in my medical decision making (see MDM for details).  Medications Ordered in ED Medications  albuterol (VENTOLIN HFA) 108 (90 Base) MCG/ACT inhaler 1-2 puff (has no administration in time range)  ipratropium-albuterol (DUONEB) 0.5-2.5 (3) MG/3ML nebulizer  solution 3 mL (3 mLs Nebulization Given 09/28/23 0757)  methylPREDNISolone sodium succinate (SOLU-MEDROL) 125 mg/2 mL injection 62.5 mg (62.5 mg Intravenous Given 09/28/23 0812)  potassium chloride SA (KLOR-CON M) CR tablet 40 mEq (40 mEq Oral Given 09/28/23 0945)  Procedures Procedures  (including critical care time)  Medical Decision Making / ED Course    Medical Decision Making:    JYREN CERASOLI is a 67 y.o. male with past medical history as below, significant for asthma, gerd, osa, DM, cannabis use, bipolar who presents to the ED with complaint of cp/dib. The complaint involves an extensive differential diagnosis and also carries with it a high risk of complications and morbidity.  Serious etiology was considered. Ddx includes but is not limited to: Differential includes all life-threatening causes for chest pain. This includes but is not exclusive to acute coronary syndrome, aortic dissection, pulmonary embolism, cardiac tamponade, community-acquired pneumonia, pericarditis, musculoskeletal chest wall pain, etc. In my evaluation of this patient's dyspnea my DDx includes, but is not limited to, pneumonia, pulmonary embolism, pneumothorax, pulmonary edema, metabolic acidosis, asthma, COPD, cardiac cause, anemia, anxiety, etc.    Complete initial physical exam performed, notably the patient  was NAD, sitting upright, no hypoxia.    Reviewed and confirmed nursing documentation for past medical history, family history, social history.  Vital signs reviewed.    Clinical Course as of 09/28/23 1042  Mon Sep 28, 2023  0454 Feeling better on recheck after neb/steroids  [SG]  0932 Potassium(!): 3.4 Replace orally  [SG]    Clinical Course User Index [SG] Tanda Rockers A, DO     Symptoms resolved at this point.  Feeling much better after DuoNeb and  Solu-Medrol. They would likely atypical chest pain, troponin negative x 2, BNP is not elevated, CHF exacerbation unlikely Chest x-ray unremarkable Pain atypical, favor MSK Dyspnea likely secondary to asthma exacerbation, he is not requiring supplemental oxygen, no wheezing.  Breathing greatly improved.  Recommend he follow-up with primary care for further evaluation and care.  He was recent started on Trelegy, advised him to continue this.  Given albuterol inhaler in the ER      The patient's chest pain is not suggestive of pulmonary embolus, cardiac ischemia, aortic dissection, pericarditis, myocarditis, pulmonary embolism, pneumothorax, pneumonia, Zoster, or esophageal perforation, or other serious etiology.  Historically not abrupt in onset, tearing or ripping, pulses symmetric. EKG nonspecific for ischemia/infarction. No dysrhythmias, brugada, WPW, prolonged QT noted.   Troponin negative x2. CXR reviewed. Labs without demonstration of acute pathology unless otherwise noted above. Low HEART Score: 0-3 points (0.9-1.7% risk of MACE).  Given the extremely low risk of these diagnoses further testing and evaluation for these possibilities does not appear to be indicated at this time. Patient in no distress and overall condition improved here in the ED. Detailed discussions were had with the patient regarding current findings, and need for close f/u with PCP or on call doctor. The patient has been instructed to return immediately if the symptoms worsen in any way for re-evaluation. Patient verbalized understanding and is in agreement with current care plan. All questions answered prior to discharge.             Additional history obtained: -Additional history obtained from na -External records from outside source obtained and reviewed including: Chart review including previous notes, labs, imaging, consultation notes including  Primary care documentation Home medications  Prior  labs/imaging    Lab Tests: -I ordered, reviewed, and interpreted labs.   The pertinent results include:   Labs Reviewed  CBC WITH DIFFERENTIAL/PLATELET - Abnormal; Notable for the following components:      Result Value   MCH 25.0 (*)    All other components within normal limits  COMPREHENSIVE METABOLIC PANEL -  Abnormal; Notable for the following components:   Sodium 133 (*)    Potassium 3.4 (*)    Glucose, Bld 216 (*)    Calcium 8.1 (*)    All other components within normal limits  BRAIN NATRIURETIC PEPTIDE  TROPONIN I (HIGH SENSITIVITY)  TROPONIN I (HIGH SENSITIVITY)    Notable for stable labs, trop neg x2, K low, replaced   EKG   EKG Interpretation Date/Time:    Ventricular Rate:    PR Interval:    QRS Duration:    QT Interval:    QTC Calculation:   R Axis:      Text Interpretation:           Imaging Studies ordered: I ordered imaging studies including CXR I independently visualized the following imaging with scope of interpretation limited to determining acute life threatening conditions related to emergency care; findings noted above I independently visualized and interpreted imaging. I agree with the radiologist interpretation   Medicines ordered and prescription drug management: Meds ordered this encounter  Medications   ipratropium-albuterol (DUONEB) 0.5-2.5 (3) MG/3ML nebulizer solution 3 mL   methylPREDNISolone sodium succinate (SOLU-MEDROL) 125 mg/2 mL injection 62.5 mg   potassium chloride SA (KLOR-CON M) CR tablet 40 mEq   albuterol (VENTOLIN HFA) 108 (90 Base) MCG/ACT inhaler 1-2 puff   predniSONE (STERAPRED UNI-PAK 21 TAB) 10 MG (21) TBPK tablet    Sig: Take by mouth daily. Take 6 tabs by mouth daily  for 2 days, then 5 tabs for 2 days, then 4 tabs for 2 days, then 3 tabs for 2 days, 2 tabs for 2 days, then 1 tab by mouth daily for 2 days    Dispense:  42 tablet    Refill:  0   albuterol (VENTOLIN HFA) 108 (90 Base) MCG/ACT inhaler    Sig:  Inhale 1-2 puffs into the lungs every 4 (four) hours as needed for wheezing or shortness of breath.    Dispense:  1 each    Refill:  0    -I have reviewed the patients home medicines and have made adjustments as needed   Consultations Obtained: na   Cardiac Monitoring: The patient was maintained on a cardiac monitor.  I personally viewed and interpreted the cardiac monitored which showed an underlying rhythm of: NSR  Social Determinants of Health:  Diagnosis or treatment significantly limited by social determinants of health: lives at home    Reevaluation: After the interventions noted above, I reevaluated the patient and found that they have improved  Co morbidities that complicate the patient evaluation  Past Medical History:  Diagnosis Date   Arthritis    Asthma    Diabetes mellitus without complication (HCC)    GERD (gastroesophageal reflux disease)    Sleep apnea       Dispostion: Disposition decision including need for hospitalization was considered, and patient discharged from emergency department.    Final Clinical Impression(s) / ED Diagnoses Final diagnoses:  Hypokalemia  Mild intermittent asthma with acute exacerbation  Atypical chest pain        Sloan Leiter, DO 09/28/23 1042

## 2023-09-28 NOTE — ED Triage Notes (Signed)
Pt c/o chest pain that started last night and SOB started this morning. Pt has asthma and went to PCP Friday. Inhaler is not working.

## 2023-10-02 DIAGNOSIS — K219 Gastro-esophageal reflux disease without esophagitis: Secondary | ICD-10-CM | POA: Diagnosis not present

## 2023-10-02 DIAGNOSIS — J4520 Mild intermittent asthma, uncomplicated: Secondary | ICD-10-CM | POA: Diagnosis not present

## 2023-10-02 DIAGNOSIS — G8929 Other chronic pain: Secondary | ICD-10-CM | POA: Diagnosis not present

## 2023-10-02 DIAGNOSIS — I10 Essential (primary) hypertension: Secondary | ICD-10-CM | POA: Diagnosis not present

## 2023-10-02 DIAGNOSIS — R0600 Dyspnea, unspecified: Secondary | ICD-10-CM | POA: Diagnosis not present

## 2023-10-02 DIAGNOSIS — M545 Low back pain, unspecified: Secondary | ICD-10-CM | POA: Diagnosis not present

## 2023-10-02 DIAGNOSIS — S39012A Strain of muscle, fascia and tendon of lower back, initial encounter: Secondary | ICD-10-CM | POA: Diagnosis not present

## 2023-10-02 DIAGNOSIS — E785 Hyperlipidemia, unspecified: Secondary | ICD-10-CM | POA: Diagnosis not present

## 2023-10-02 DIAGNOSIS — E1165 Type 2 diabetes mellitus with hyperglycemia: Secondary | ICD-10-CM | POA: Diagnosis not present

## 2023-10-20 ENCOUNTER — Other Ambulatory Visit (HOSPITAL_COMMUNITY): Payer: Self-pay | Admitting: Radiology

## 2023-10-20 DIAGNOSIS — R0600 Dyspnea, unspecified: Secondary | ICD-10-CM

## 2023-10-29 ENCOUNTER — Ambulatory Visit (HOSPITAL_COMMUNITY)
Admission: RE | Admit: 2023-10-29 | Discharge: 2023-10-29 | Disposition: A | Discharge status: Home / Self Care | Payer: Medicare Other | Source: Ambulatory Visit | Attending: Family Medicine | Admitting: Family Medicine

## 2023-10-29 DIAGNOSIS — R0600 Dyspnea, unspecified: Secondary | ICD-10-CM | POA: Diagnosis not present

## 2023-10-29 LAB — PULMONARY FUNCTION TEST
DL/VA % pred: 130 %
DL/VA: 5.43 ml/min/mmHg/L
DLCO unc % pred: 57 %
DLCO unc: 13.34 ml/min/mmHg
FEF 25-75 Post: 1.57 L/s
FEF 25-75 Pre: 1.07 L/s
FEF2575-%Change-Post: 46 %
FEF2575-%Pred-Post: 70 %
FEF2575-%Pred-Pre: 48 %
FEV1-%Change-Post: 9 %
FEV1-%Pred-Post: 44 %
FEV1-%Pred-Pre: 40 %
FEV1-Post: 1.26 L
FEV1-Pre: 1.15 L
FEV1FVC-%Change-Post: 8 %
FEV1FVC-%Pred-Pre: 106 %
FEV6-%Change-Post: 1 %
FEV6-%Pred-Post: 40 %
FEV6-%Pred-Pre: 39 %
FEV6-Post: 1.46 L
FEV6-Pre: 1.45 L
FEV6FVC-%Change-Post: 0 %
FEV6FVC-%Pred-Post: 106 %
FEV6FVC-%Pred-Pre: 105 %
FVC-%Change-Post: 0 %
FVC-%Pred-Post: 38 %
FVC-%Pred-Pre: 37 %
FVC-Post: 1.46 L
FVC-Pre: 1.45 L
Post FEV1/FVC ratio: 86 %
Post FEV6/FVC ratio: 100 %
Pre FEV1/FVC ratio: 79 %
Pre FEV6/FVC Ratio: 100 %
RV % pred: 66 %
RV: 1.46 L
TLC % pred: 47 %
TLC: 2.93 L

## 2023-10-29 MED ORDER — ALBUTEROL SULFATE (2.5 MG/3ML) 0.083% IN NEBU
2.5000 mg | INHALATION_SOLUTION | Freq: Once | RESPIRATORY_TRACT | Status: AC
  Administered 2023-10-29: 2.5 mg via RESPIRATORY_TRACT

## 2023-11-02 DIAGNOSIS — M25511 Pain in right shoulder: Secondary | ICD-10-CM | POA: Diagnosis not present

## 2023-11-03 ENCOUNTER — Encounter: Payer: Self-pay | Admitting: *Deleted

## 2023-11-24 ENCOUNTER — Other Ambulatory Visit: Payer: Self-pay

## 2023-11-24 ENCOUNTER — Emergency Department (HOSPITAL_COMMUNITY)
Admission: EM | Admit: 2023-11-24 | Discharge: 2023-11-24 | Disposition: A | Discharge status: Home / Self Care | Payer: Medicare Other | Attending: Emergency Medicine | Admitting: Emergency Medicine

## 2023-11-24 ENCOUNTER — Emergency Department (HOSPITAL_COMMUNITY): Payer: Medicare Other

## 2023-11-24 ENCOUNTER — Encounter (HOSPITAL_COMMUNITY): Payer: Self-pay | Admitting: Emergency Medicine

## 2023-11-24 DIAGNOSIS — J45909 Unspecified asthma, uncomplicated: Secondary | ICD-10-CM | POA: Insufficient documentation

## 2023-11-24 DIAGNOSIS — W010XXA Fall on same level from slipping, tripping and stumbling without subsequent striking against object, initial encounter: Secondary | ICD-10-CM | POA: Diagnosis not present

## 2023-11-24 DIAGNOSIS — E119 Type 2 diabetes mellitus without complications: Secondary | ICD-10-CM | POA: Insufficient documentation

## 2023-11-24 DIAGNOSIS — Y9301 Activity, walking, marching and hiking: Secondary | ICD-10-CM | POA: Insufficient documentation

## 2023-11-24 DIAGNOSIS — S8991XA Unspecified injury of right lower leg, initial encounter: Secondary | ICD-10-CM | POA: Diagnosis not present

## 2023-11-24 DIAGNOSIS — Z7984 Long term (current) use of oral hypoglycemic drugs: Secondary | ICD-10-CM | POA: Diagnosis not present

## 2023-11-24 DIAGNOSIS — M25562 Pain in left knee: Secondary | ICD-10-CM | POA: Insufficient documentation

## 2023-11-24 DIAGNOSIS — M25462 Effusion, left knee: Secondary | ICD-10-CM | POA: Diagnosis not present

## 2023-11-24 DIAGNOSIS — Y92009 Unspecified place in unspecified non-institutional (private) residence as the place of occurrence of the external cause: Secondary | ICD-10-CM | POA: Insufficient documentation

## 2023-11-24 DIAGNOSIS — Z043 Encounter for examination and observation following other accident: Secondary | ICD-10-CM | POA: Diagnosis not present

## 2023-11-24 DIAGNOSIS — Z7982 Long term (current) use of aspirin: Secondary | ICD-10-CM | POA: Diagnosis not present

## 2023-11-24 MED ORDER — ACETAMINOPHEN 500 MG PO TABS
1000.0000 mg | ORAL_TABLET | Freq: Once | ORAL | Status: AC
  Administered 2023-11-24: 1000 mg via ORAL
  Filled 2023-11-24: qty 2

## 2023-11-24 NOTE — ED Provider Notes (Signed)
[place] EMERGENCY DEPARTMENT AT [facility] Provider Note   CSN: [id number] Arrival date & time: 11/24/23  4010     History  Chief Complaint  Patient presents with   Peter Lewis is a 67 y.o. male with PMH as listed below who presents after trip fall this morning, states he was walking, tripped and landed onto his knees. Pain to bilateral knees, mostly the left. Ambulatory into triage. States he is able to walk but with discomfort. Denies N/T.  Denies LOC, thinners or striking head. Tried ibuprofen at home.  Past Medical History:  Diagnosis Date   Arthritis    Asthma    Diabetes mellitus without complication (HCC)    GERD (gastroesophageal reflux disease)    Sleep apnea        Home Medications Prior to Admission medications   Medication Sig Start Date End Date Taking? Authorizing Provider  acetaminophen (TYLENOL) 650 MG CR tablet Take 1,300 mg by mouth every 8 (eight) hours as needed for pain.    [provider]  albuterol (VENTOLIN HFA) 108 (90 Base) MCG/ACT inhaler Inhale 1-2 puffs into the lungs every 4 (four) hours as needed for wheezing or shortness of breath. 09/28/23   Sloan Leiter, DO  albuterol (VENTOLIN HFA) 108 (90 Base) MCG/ACT inhaler Inhale 2 puffs into the lungs every 4 (four) hours as needed for shortness of breath or wheezing. 06/23/23   [provider]  aspirin EC 81 MG tablet Take 81 mg by mouth daily.    [provider]  atorvastatin (LIPITOR) 20 MG tablet Take by mouth. 10/10/22   [provider]  cyclobenzaprine (FLEXERIL) 10 MG tablet Take 10 mg by mouth daily as needed for muscle spasms.     [provider]  empagliflozin (JARDIANCE) 25 MG TABS tablet Take by mouth. 09/17/23   [provider]  Fluticasone-Umeclidin-Vilant (TRELEGY ELLIPTA) 100-62.5-25 MCG/ACT AEPB Inhale 1 puff every day by inhalation route for 30 days. Patient not taking: Reported on 09/28/2023 09/23/23    [provider]  ibuprofen (ADVIL,MOTRIN) 200 MG tablet Take 600 mg by mouth every 6 (six) hours as needed for moderate pain.    [provider]  losartan (COZAAR) 100 MG tablet Take 0.5 tablets by mouth daily. 06/11/23   [provider]  metFORMIN (GLUCOPHAGE) 500 MG tablet Take 500 mg by mouth 2 (two) times daily with a meal.    [provider]  omeprazole (PRILOSEC) 20 MG capsule Take by mouth. 04/06/07   [provider]  predniSONE (STERAPRED UNI-PAK 21 TAB) 10 MG (21) TBPK tablet Take by mouth daily. Take 6 tabs by mouth daily  for 2 days, then 5 tabs for 2 days, then 4 tabs for 2 days, then 3 tabs for 2 days, 2 tabs for 2 days, then 1 tab by mouth daily for 2 days 09/29/23   Sloan Leiter, DO  sertraline (ZOLOFT) 100 MG tablet Take by mouth. 01/12/23   [provider]  tadalafil (CIALIS) 20 MG tablet Take by mouth. 10/10/22   [provider]  traZODone (DESYREL) 50 MG tablet Take by mouth. 01/12/23   [provider]      Allergies    Patient has no known allergies.    Review of Systems   Review of Systems A 10 point review of systems was performed and is negative unless otherwise reported in HPI.  Physical Exam Updated Vital Signs BP (!) 161/73 (BP Location:  Left Arm)   Pulse 69   Temp 98.7 F (37.1 C) (Oral)   Resp 16   Wt 106.6 kg   SpO2 96%   BMI 37.93 kg/m  Physical Exam General: Normal appearing male, lying in bed.  HEENT: Sclera anicteric, MMM, trachea midline. NCAT. Cardiology: RRR, no murmurs/rubs/gallops.  Resp: Normal respiratory rate and effort. CTAB, no wheezes, rhonchi, crackles.  Abd: Soft, non-tender, non-distended. No rebound tenderness or guarding.   MSK:  R KNEE: Mild TTP to R anterior knee/patella with no deformity, signs of trauma, open wounds. Intact flexion/extension though somewhat limited by pain. Negative anterior/posterior drawer. No significant effusion. NVI. Compartments  soft. L KNEE: Mild TTP to L anterior knee/patella with no deformity, signs of trauma, or open wounds. Intact flexion/extension thought somewhat limited by pain. Negative anterior/posterior drawer. Mild effusion present. NVI. Compartments soft.  Skin: warm, dry.  Neuro: A&Ox4, CNs II-XII grossly intact. MAEs. Sensation grossly intact.  Psych: Normal mood and affect.   ED Results / Procedures / Treatments   Labs (all labs ordered are listed, but only abnormal results are displayed) Labs Reviewed - No data to display  EKG None  Radiology DG Knee 2 Views Left  Result Date: 11/24/2023 CLINICAL DATA:  Fall this morning, landing on the knees. EXAM: LEFT KNEE - 2 VIEW COMPARISON:  None Available. FINDINGS: No visible fracture or subluxation. There is a suprapatellar joint effusion. No degenerative spurring. IMPRESSION: Joint effusion without fracture or subluxation. Electronically Signed   By: Tiburcio Pea M.D.   On: 11/24/2023 07:00   DG Knee 2 Views Right  Result Date: 11/24/2023 CLINICAL DATA:  Evaluate for injury. Fall this morning landing on the knees. EXAM: RIGHT KNEE - 2 VIEW COMPARISON:  None Available. FINDINGS: No evidence of fracture, dislocation, or joint effusion. No evidence of arthropathy or other focal bone abnormality. Soft tissues are unremarkable. IMPRESSION: Negative. Electronically Signed   By: Tiburcio Pea M.D.   On: 11/24/2023 06:59    Procedures Procedures    Medications Ordered in ED Medications  acetaminophen (TYLENOL) tablet 1,000 mg (1,000 mg Oral Given 11/24/23 0731)    ED Course/ Medical Decision Making/ A&P                          Medical Decision Making Amount and/or Complexity of Data Reviewed Radiology:  Decision-making details documented in ED Course.  Risk OTC drugs.    This patient presents to the ED for concern of fall w/ BL Knee pain L>R, this involves an extensive number of treatment options, and is a complaint that carries with  it a high risk of complications and morbidity.   MDM:    LOW RISK This patient presents with knee pain, suspicious for knee contusions. He does have mild effusion on L but is ambulatory and able to flex and extend although somewhat limited by pain. XRs reassuring though shows L effusion. Considered, but doubt, tibial plateau fracture, septic arthritis, other acute unstable fracture, or significant neurovascular compromise. Considered other ligamentous injury such as meniscus injury especially on L given mild effusion and discussed with patient f/u with PCP vs orthopedic surgery for further imaging nonemergent basis such as MRI if his symptoms do not improve. He is offered knee immobilizer/crutches but states he is able to ambulate. Given tylenol and ace wraps for compression/pain control.    Presentation most consistent with knee sprains/contusions without joint instability. History, Exam, and Workup not consistent with fracture, compartment  syndrome, arterial or nerve injury. 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  Disposition: Discharge. Strict return precautions discussed and understood at bedside. Follow up with primary care doctor within next week for symptom follow up.  Imaging Studies ordered: BL 2V Knee XRs ordered from triage I independently visualized and interpreted imaging. I agree with the radiologist interpretation  Additional history obtained from chart review.  Social Determinants of Health: Lives independently  Disposition:  DC w/ discharge instructions/return precautions. All questions answered to patient's satisfaction.    Co morbidities that complicate the patient evaluation  Past Medical History:  Diagnosis Date   Arthritis    Asthma    Diabetes mellitus without complication (HCC)    GERD (gastroesophageal reflux disease)    Sleep apnea      Medicines Meds ordered this encounter  Medications   acetaminophen (TYLENOL)  tablet 1,000 mg    I have reviewed the patients home medicines and have made adjustments as needed  Problem List / ED Course: Problem List Items Addressed This Visit   None Visit Diagnoses     Fall in home, initial encounter    -  Primary   Acute pain of left knee                       This note was created using dictation software, which may contain spelling or grammatical errors.    Loetta Rough, MD 11/24/23 [phone]

## 2023-11-24 NOTE — ED Notes (Signed)
Ace wraps applied

## 2023-11-24 NOTE — Discharge Instructions (Addendum)
Thank you for coming to [facility] Emergency Department. You were seen for fall with knee pain, left worse than right. We did an exam, and imaging, and these showed swelling of the left knee with no fractures in either side.  Please rest, ice, and elevate your knee. We have provided you with ace wraps to help with swelling as well. You can alternate taking Tylenol and ibuprofen as needed for pain. You can take 650mg  tylenol (acetaminophen) every 4-6 hours, and 600 mg ibuprofen 3 times a day.  It is possible that you may have a ligamentous injury to your knee, which would need to be further evaluated with your primary care physician or an orthopedic physician.  Please follow up with your primary care provider within 1 week.   Do not hesitate to return to the ED or call 911 if you experience: -Worsening symptoms -Numbness/tingling -Inability to walk due to pain -Lightheadedness, passing out -Fevers/chills -Anything else that concerns you

## 2023-11-24 NOTE — ED Notes (Signed)
Provider at bedside

## 2023-11-24 NOTE — ED Triage Notes (Signed)
Pt in after trip fall this morning, states he was walking, tripped and landed onto his knees. Pain to bilateral knees, mostly the left. Ambulatory into triage. Denies LOC, thinners or striking head.

## 2023-12-25 ENCOUNTER — Institutional Professional Consult (permissible substitution): Payer: Medicare Other | Admitting: Internal Medicine

## 2023-12-30 DIAGNOSIS — J069 Acute upper respiratory infection, unspecified: Secondary | ICD-10-CM | POA: Diagnosis not present

## 2023-12-30 DIAGNOSIS — Z7689 Persons encountering health services in other specified circumstances: Secondary | ICD-10-CM | POA: Diagnosis not present

## 2024-01-15 NOTE — Progress Notes (Unsigned)
Peter Lewis, male    DOB: 12/24/1955    MRN: [id number]   Brief patient profile:  17  yobm  never smoker/ works at [employer] referred to pulmonary clinic in [place]  01/18/2024 by Dr Margo Aye  for doe/cough x winter of 2024    [facility]  Jan 2025    History of Present Illness  01/18/2024  Pulmonary/ 1st office eval/ She[facility] S[facility] / [facility] /[facility] maint Chief Complaint  Patient presents with   Consult  Dyspnea:  still able to walk around warehouse / is able to do [employer] / uses [facility] parking   Cough: esp at hs white mucus Sleep: bed is flat 3 pillows wakes him up 3-4   q  noct then sleeps in recliner  SABA use: 3-4 x per day  02 L  none   No obvious day to day or daytime pattern/variability or assoc  purulent sputum or mucus plugs or hemoptysis or cp or chest tightness, subjective wheeze or overt sinus or hb symptoms.    Also denies any obvious fluctuation of symptoms with weather or environmental changes or other aggravating or alleviating factors except as outlined above   No unusual exposure hx or h/o childhood pna  or knowledge of premature birth.  Current Allergies, Complete Past Medical History, Past Surgical History, Family History, and Social History were reviewed in [employer] [facility] record.  ROS  The following are not active complaints unless bolded Hoarseness, sore throat, dysphagia, dental problems, itching, sneezing,  nasal congestion or discharge of excess mucus or purulent secretions, ear ache,   fever, chills, sweats, unintended wt loss or wt gain, classically pleuritic or exertional cp,  orthopnea pnd or arm/hand swelling  or leg swelling, presyncope, palpitations, abdominal pain, anorexia, nausea, vomiting, diarrhea  or change in bowel habits or change in bladder habits, change in stools or change in urine, dysuria, hematuria,  rash, arthralgias, visual complaints, headache, numbness, weakness or ataxia or problems with walking or  coordination,  change in mood or  memory.            Outpatient Medications Prior to Visit - - NOTE:   Unable to verify as accurately reflecting what pt takes    Medication Sig Dispense Refill   acetaminophen (TYLENOL) 650 MG CR tablet Take 1,300 mg by mouth every 8 (eight) hours as needed for pain.     albuterol (VENTOLIN HFA) 108 (90 Base) MCG/ACT inhaler Inhale 2 puffs into the lungs every 4 (four) hours as needed for shortness of breath or wheezing.     aspirin EC 81 MG tablet Take 81 mg by mouth daily.     atorvastatin (LIPITOR) 20 MG tablet Take by mouth.     empagliflozin (JARDIANCE) 25 MG TABS tablet Take by mouth.     Fluticasone-Umeclidin-Vilant (TRELEGY ELLIPTA) 100-62.5-25 MCG/ACT AEPB      ibuprofen (ADVIL,MOTRIN) 200 MG tablet Take 600 mg by mouth every 6 (six) hours as needed for moderate pain.     losartan (COZAAR) 100 MG tablet Take 0.5 tablets by mouth daily.     metFORMIN (GLUCOPHAGE) 500 MG tablet Take 500 mg by mouth 2 (two) times daily with a meal.     omeprazole (PRILOSEC) 20 MG capsule Take by mouth.     sertraline (ZOLOFT) 100 MG tablet Take by mouth.     tadalafil (CIALIS) 20 MG tablet Take by mouth.     traZODone (DESYREL) 50 MG tablet Take by mouth.  albuterol (VENTOLIN HFA) 108 (90 Base) MCG/ACT inhaler Inhale 1-2 puffs into the lungs every 4 (four) hours as needed for wheezing or shortness of breath. (Patient not taking: Reported on 01/18/2024) 1 each 0   cyclobenzaprine (FLEXERIL) 10 MG tablet Take 10 mg by mouth daily as needed for muscle spasms.  (Patient not taking: Reported on 01/18/2024)     predniSONE (STERAPRED UNI-PAK 21 TAB) 10 MG (21) TBPK tablet Take by mouth daily. Take 6 tabs by mouth daily  for 2 days, then 5 tabs for 2 days, then 4 tabs for 2 days, then 3 tabs for 2 days, 2 tabs for 2 days, then 1 tab by mouth daily for 2 days (Patient not taking: Reported on 01/18/2024) 42 tablet 0   No facility-administered medications prior to visit.    Past  Medical History:  Diagnosis Date   Arthritis    Asthma    Diabetes mellitus without complication (HCC)    GERD (gastroesophageal reflux disease)    Sleep apnea       Objective:     BP (!) 171/82   Pulse 77   Ht 6' (1.829 m)   Wt 261 lb 3.2 oz (118.5 kg)   SpO2 93% Comment: room air  BMI 35.43 kg/m   SpO2: 93 % (room air)  Mod obese (by BMI)  pleasant amb bm nad   HEENT : Oropharynx  clear/ dentition ok / pseudowheeze   NECK :  without  apparent JVD/ palpable Nodes/TM    LUNGS: no acc muscle use,  Nl contour chest which is clear to A and P bilaterally without cough on insp or exp maneuvers   CV:  RRR  no s3 or murmur or increase in P2, and no edema   ABD: Quite protuberant/obese  soft and nontender   MS:  Gait nl   ext warm without deformities Or obvious joint restrictions  calf tenderness, cyanosis or clubbing    SKIN: warm and dry without lesions    NEURO:  alert, approp, nl sensorium with  no motor or cerebellar deficits apparent.    I personally reviewed images and agree with radiology impression as follows:  CXR:   pa and lateral 09/23/23  No acute findings     Assessment   No problem-specific Assessment & Plan notes found for this encounter.     Sandrea Hughs, MD 01/18/2024

## 2024-01-18 ENCOUNTER — Encounter: Payer: Self-pay | Admitting: Internal Medicine

## 2024-01-18 ENCOUNTER — Ambulatory Visit: Payer: Medicare Other | Admitting: Internal Medicine

## 2024-01-18 VITALS — BP 171/82 | HR 77 | Ht 72.0 in | Wt 261.2 lb

## 2024-01-18 DIAGNOSIS — J4489 Other specified chronic obstructive pulmonary disease: Secondary | ICD-10-CM | POA: Insufficient documentation

## 2024-01-18 MED ORDER — EMPAGLIFLOZIN 25 MG PO TABS: 25.0000 mg | ORAL_TABLET | Freq: Every day | ORAL | Status: AC

## 2024-01-18 MED ORDER — ALBUTEROL SULFATE HFA 108 (90 BASE) MCG/ACT IN AERS: INHALATION_SPRAY | RESPIRATORY_TRACT | 11 refills | Status: AC

## 2024-01-18 MED ORDER — BUDESONIDE-FORMOTEROL FUMARATE 80-4.5 MCG/ACT IN AERO: INHALATION_SPRAY | RESPIRATORY_TRACT | 12 refills | Status: AC

## 2024-01-18 NOTE — Patient Instructions (Addendum)
Plan A = Automatic = Always=    Symbicort 80 (Breyna) Take 2 puffs first thing in am and then another 2 puffs about 12 hours later.    Work on inhaler technique:  relax and gently blow all the way out then take a nice smooth full deep breath back in, triggering the inhaler at same time you start breathing in.  Hold breath in for at least  5 seconds if you can. Blow out thru nose. Rinse and gargle with water when done.  If mouth or throat bother you at all,  try brushing teeth/gums/tongue with arm and hammer toothpaste/ make a slurry and gargle and spit out.       Plan B = Backup (to supplement plan A, not to replace it) Only use your albuterol inhaler as a rescue medication to be used if you can't catch your breath by resting or doing a relaxed purse lip breathing pattern.  - The less you use it, the better it will work when you need it. - Ok to use the inhaler up to 2 puffs  every 4 hours if you must but call for appointment if use goes up over your usual need - Don't leave home without it !!  (think of it like the spare tire for your car)     Please schedule a follow up office visit in 6 weeks, call sooner if needed with all medications /inhalers/ solutions in hand so we can verify exactly what you are taking. This includes all medications from all doctors and over the counters

## 2024-01-18 NOTE — Assessment & Plan Note (Addendum)
Onset in childhood then resolved and recurred winter 2024  -  01/18/2024  After extensive coaching inhaler device,  effectiveness =   50% from a baseline of < 25% (early trigger, short ti) > try symbicort 80 2bid as cough is the main symptom  DDX of  difficult airways management almost all start with A and  include Adherence, Ace Inhibitors, Acid Reflux, Active Sinus Disease, Alpha 1 Antitripsin deficiency, Anxiety masquerading as Airways dz,  ABPA,  Allergy(esp in young), Aspiration (esp in elderly), Adverse effects of meds,  Active smoking or vaping, A bunch of PE's (a small clot burden can't cause this syndrome unless there is already severe underlying pulm or vascular dz with poor reserve) plus two Bs  = Bronchiectasis and Beta blocker use..and one C= CHF  Adherence is always the initial "prime suspect" and is a multilayered concern that requires a "trust but verify" approach in every patient - starting with knowing how to use medications, especially inhalers, correctly, keeping up with refills and understanding the fundamental difference between maintenance and prns vs those medications only taken for a very short course and then stopped and not refilled.  - see hfa teaching  ? Acid (or non-acid) GERD > always difficult to exclude as up to 75% of pts in some series report no assoc GI/ Heartburn symptoms> rec continue max (24h)  acid suppression and diet restrictions/ reviewed     ? Adverse drug effects > pseudowheezing on dpi > try off trelegy cause this isn't copd either   ? Allergy /asthma > try low dose symbicort /breyna x 6 weeks for sob and cough plus prn saba: Re SABA :  I spent extra time with pt today reviewing appropriate use of albuterol for prn use on exertion with the following points: 1) saba is for relief of sob that does not improve by walking a slower pace or resting but rather if the pt does not improve after trying this first. 2) If the pt is convinced, as many are, that saba  helps recover from activity faster then it's easy to tell if this is the case by re-challenging : ie stop, take the inhaler, then p 5 minutes try the exact same activity (intensity of workload) that just caused the symptoms and see if they are substantially diminished or not after saba 3) if there is an activity that reproducibly causes the symptoms, try the saba 15 min before the activity on alternate days  If in fact the saba really does help, then fine to continue to use it prn but advised may need to look closer at the maintenance regimen being used to achieve better control of airways disease with exertion.   ? Anxiety/depression / deconditioing with wt gain > usually at the bottom of this list of usual suspects but should be much higher on this pt's based on H and P and note already on psychotropics and may interfere with adherence and also interpretation of response or lack thereof to symptom management which can be quite subjective.    >>> f/u in 6 weeks with all meds in hand using a trust but verify approach to confirm accurate Medication  Reconciliation The principal here is that until we are certain that the  patients are doing what we've asked, it makes no sense to ask them to do more.          Each maintenance medication was reviewed in detail including emphasizing most importantly the difference between maintenance and prns and  under what circumstances the prns are to be triggered using an action plan format where appropriate.  Total time for H and P, chart review, counseling, reviewing hfa/ dpi device(s) and generating customized AVS unique to this office visit / same day charting = 60 min  or multiple  refractory respiratory  symptoms of uncertain etiology

## 2024-03-01 ENCOUNTER — Telehealth (HOSPITAL_COMMUNITY): Payer: Self-pay | Admitting: *Deleted

## 2024-03-01 ENCOUNTER — Other Ambulatory Visit (HOSPITAL_COMMUNITY): Payer: Self-pay

## 2024-03-01 DIAGNOSIS — J986 Disorders of diaphragm: Secondary | ICD-10-CM

## 2024-03-01 NOTE — Telephone Encounter (Signed)
 Referred to Pulmonary rehab by Dr. Charline Bills with the diagnosis of COPD. Clinical review of pt follow up appt on 02/22/24 with Dr. Charline Bills at the [facility] - pulmonary office note. Pt appropriate for scheduling for on site Pulmonary Rehab.   Called pt and discussed the program. He is interested and pt scheduled for 03/11/24 at 1 pm. He will begin exercise 03/17/24 at 1:15. Sent welcome letter to pt in the mail.   Pt has [facility] coverage.  Ethelda Chick BS, ACSM-CEP 03/01/2024 12:09 PM

## 2024-03-07 ENCOUNTER — Ambulatory Visit: Payer: Medicare Other | Admitting: Internal Medicine

## 2024-03-07 ENCOUNTER — Other Ambulatory Visit (HOSPITAL_COMMUNITY)

## 2024-03-07 ENCOUNTER — Encounter (HOSPITAL_COMMUNITY): Payer: Self-pay

## 2024-03-10 ENCOUNTER — Telehealth (HOSPITAL_COMMUNITY): Payer: Self-pay

## 2024-03-10 ENCOUNTER — Other Ambulatory Visit (HOSPITAL_COMMUNITY): Payer: Self-pay

## 2024-03-10 NOTE — Telephone Encounter (Signed)
 Called to confirm appt. Pt confirmed appt. Instructed pt on proper footwear. Gave directions along with department number.

## 2024-03-11 ENCOUNTER — Encounter (HOSPITAL_COMMUNITY): Payer: Self-pay | Admitting: *Deleted

## 2024-03-11 ENCOUNTER — Encounter (HOSPITAL_COMMUNITY): Payer: Self-pay

## 2024-03-11 ENCOUNTER — Encounter (HOSPITAL_COMMUNITY)
Admission: RE | Admit: 2024-03-11 | Discharge: 2024-03-11 | Disposition: A | Discharge status: Home / Self Care | Source: Ambulatory Visit | Attending: Pulmonary Disease | Admitting: Pulmonary Disease

## 2024-03-11 VITALS — BP 152/82 | HR 68 | Wt 261.5 lb

## 2024-03-11 DIAGNOSIS — J449 Chronic obstructive pulmonary disease, unspecified: Secondary | ICD-10-CM | POA: Insufficient documentation

## 2024-03-11 HISTORY — DX: Essential (primary) hypertension: I10

## 2024-03-11 HISTORY — DX: Hyperlipidemia, unspecified: E78.5

## 2024-03-11 NOTE — Progress Notes (Signed)
 Peter Lewis 68 y.o. male Pulmonary Rehab Orientation Note This patient who was referred to Pulmonary Rehab by Dr. Charline Bills with the diagnosis of COPD 3 arrived today in Cardiac and Pulmonary Rehab. He arrived ambulatory with normal gait. He does not carry portable oxygen.  Per patient, Peter Lewis uses oxygen never. Color good, skin warm and dry. Patient is oriented to time and place. Patient's medical history, psychosocial health, and medications reviewed. Psychosocial assessment reveals patient lives with spouse. Cid is currently full time job working at [employer]. Patient hobbies include  fishing . Patient reports his stress level is low. Areas of stress/anxiety include health and family . Patient does not exhibit signs of depression. PHQ2/9 score 0/0. Peter Lewis shows good  coping skills with positive outlook on life. Offered emotional support and reassurance. Will continue to monitor. Physical assessment performed by Nurse pick: Essie Hart RN. Please see their orientation physical assessment note. [name] reports he  does take medications as prescribed. Patient states he  follows a regular  diet. Pt reports he would like to lose weight. Patient's weight will be monitored closely. Demonstration and practice of PLB using pulse oximeter. Peter Lewis able to return demonstration satisfactorily. Safety and hand hygiene in the exercise area reviewed with patient. Asante voices understanding of the information reviewed. Department expectations discussed with patient and achievable goals were set. The patient shows enthusiasm about attending the program and we look forward to working with Ramon Dredge. Peter Lewis completed a 6 min walk test today and is scheduled to begin exercise on 03/17/24 @1315 .   [id number] Guss Bunde, BSRT

## 2024-03-11 NOTE — Progress Notes (Addendum)
 Pulmonary Rehab Orientation Physical Assessment Note    Well appearing, A&Ox4, NAD Eyes/Ears: wears corrective glasses Lungs: Clear with no wheezes, rales, rhonchi, denies chronic cough, dyspnea on exertion Heart: Regular rate rhythm, no murmurs, no rubs, no clicks Gastrointestinal: abdomin soft, + bowel sounds in all 4 quads, denies recent weight gain due to prednisone use, endorses normal BMs Genitourinary: WNL, pt denies s/s Extremities:  +2 pulses, grip strength equal, strong, +1 edema to BLE, no cyanosis, no clubbing Integumentary: pt denies any rashes, open or non healing wounds Psy/Soc: Pt has a history of depression, is on medication, and sees a therapist regularly. Pt denies any additional needs at this time  Assistive devices: Pt uses CPAP nightly

## 2024-03-11 NOTE — Progress Notes (Signed)
 Pulmonary Individual Treatment Plan  Patient Details  Name: Peter Lewis MRN: [id number] Date of Birth: 1956/11/27 Referring Provider:   Doristine Devoid Pulmonary Rehab Walk Test from 03/11/2024 in [facility]  Referring Provider Briones  [Ellison]       Initial Encounter Date:  Flowsheet Row Pulmonary Rehab Walk Test from 03/11/2024 in [facility] for [facility], [facility]ascular, [facility]  Date 03/11/24       Visit Diagnosis: Chronic obstructive pulmonary disease, unspecified COPD type (HCC)  Patient's Home Medications on Admission:   Current Outpatient Medications:    acetaminophen (TYLENOL) 650 MG CR tablet, Take 1,300 mg by mouth every 8 (eight) hours as needed for pain., Disp: , Rfl:    albuterol (PROAIR HFA) 108 (90 Base) MCG/ACT inhaler, 2 puffs every 4 hours as needed only  if your can't catch your breath, Disp: 18 g, Rfl: 11   aspirin EC 81 MG tablet, Take 81 mg by mouth daily., Disp: , Rfl:    atorvastatin (LIPITOR) 20 MG tablet, Take by mouth., Disp: , Rfl:    budesonide-formoterol (SYMBICORT) 80-4.5 MCG/ACT inhaler, Take 2 puffs first thing in am and then another 2 puffs about 12 hours later., Disp: 1 each, Rfl: 12   Carboxymethylcellulose Sod PF 0.5 % SOLN, Apply to eye., Disp: , Rfl:    cetirizine (ZYRTEC) 10 MG tablet, Take 10 mg by mouth daily., Disp: , Rfl:    empagliflozin (JARDIANCE) 25 MG TABS tablet, Take 1 tablet (25 mg total) by mouth daily., Disp: , Rfl:    fluticasone (FLONASE) 50 MCG/ACT nasal spray, Place into the nose., Disp: , Rfl:    ibuprofen (ADVIL,MOTRIN) 200 MG tablet, Take 600 mg by mouth every 6 (six) hours as needed for moderate pain., Disp: , Rfl:    ketotifen (ZADITOR) 0.035 % ophthalmic solution, Apply to eye., Disp: , Rfl:    losartan (COZAAR) 100 MG tablet, Take 0.5 tablets by mouth daily., Disp: , Rfl:    metFORMIN (GLUCOPHAGE) 500 MG tablet, Take 500 mg by mouth 2  (two) times daily with a meal., Disp: , Rfl:    montelukast (SINGULAIR) 10 MG tablet, Take 10 mg by mouth., Disp: , Rfl:    omeprazole (PRILOSEC) 20 MG capsule, Take by mouth., Disp: , Rfl:    sertraline (ZOLOFT) 100 MG tablet, Take by mouth., Disp: , Rfl:    tadalafil (CIALIS) 20 MG tablet, Take by mouth., Disp: , Rfl:    traZODone (DESYREL) 50 MG tablet, Take by mouth., Disp: , Rfl:   Past Medical History: Past Medical History:  Diagnosis Date   Arthritis    Asthma    Diabetes mellitus without complication (HCC)    GERD (gastroesophageal reflux disease)    Hyperlipidemia    Hypertension    Sleep apnea     Tobacco Use: Social History   Tobacco Use  Smoking Status Never  Smokeless Tobacco Never    Labs: Review Flowsheet       Latest Ref Rng & Units 06/22/2015 05/07/2018  Labs for ITP Cardiac and Pulmonary Rehab  Hemoglobin A1c 4.8 - 5.6 % - 5.4   TCO2 0 - 100 mmol/L 26  -    Capillary Blood Glucose: Lab Results  Component Value Date   GLUCAP 116 (H) 05/14/2018   GLUCAP 109 (H) 05/14/2018   GLUCAP 113 (H) 05/07/2018   GLUCAP 111 (H) 07/22/2015     Pulmonary Assessment Scores:  Pulmonary Assessment Scores     Row Name 03/11/24 1349         ADL UCSD   ADL Phase Entry     SOB Score total 61       CAT Score   CAT Score 21       mMRC Score   mMRC Score 2             UCSD: Self-administered rating of dyspnea associated with activities of daily living (ADLs) 6-point scale (0 = "not at all" to 5 = "maximal or unable to do because of breathlessness")  Scoring Scores range from 0 to 120.  Minimally important difference is 5 units  CAT: CAT can identify the health impairment of COPD patients and is better correlated with disease progression.  CAT has a scoring range of zero to 40. The CAT score is classified into four groups of low (less than 10), medium (10 - 20), high (21-30) and very high (31-40) based on the impact level of disease on health status. A  CAT score over 10 suggests significant symptoms.  A worsening CAT score could be explained by an exacerbation, poor medication adherence, poor inhaler technique, or progression of COPD or comorbid conditions.  CAT MCID is 2 points  mMRC: mMRC (Modified Medical Research Council) Dyspnea Scale is used to assess the degree of baseline functional disability in patients of respiratory disease due to dyspnea. No minimal important difference is established. A decrease in score of 1 point or greater is considered a positive change.   Pulmonary Function Assessment:  Pulmonary Function Assessment - 03/11/24 1349       Breath   Bilateral Breath Sounds Clear    Shortness of Breath Yes;Limiting activity             Exercise Target Goals: Exercise Program Goal: Individual exercise prescription set using results from initial 6 min walk test and THRR while considering  patient's activity barriers and safety.   Exercise Prescription Goal: Initial exercise prescription builds to 30-45 minutes a day of aerobic activity, 2-3 days per week.  Home exercise guidelines will be given to patient during program as part of exercise prescription that the participant will acknowledge.  Activity Barriers & Risk Stratification:  Activity Barriers & Cardiac Risk Stratification - 03/11/24 1321       Activity Barriers & Cardiac Risk Stratification   Activity Barriers Neck/Spine Problems;Arthritis;Back Problems;Joint Problems;Deconditioning;Muscular Weakness;Shortness of Breath;History of Falls    Cardiac Risk Stratification Moderate             6 Minute Walk:  6 Minute Walk     Row Name 03/11/24 1413         6 Minute Walk   Phase Initial     Distance 1024 feet     Walk Time 6 minutes     # of Rest Breaks 0     MPH 1.94     METS 1.56     RPE 11     Perceived Dyspnea  1     VO2 Peak 5.45     Symptoms No     Resting HR 87 bpm     Resting BP 148/76     Resting Oxygen Saturation  97 %      Exercise Oxygen Saturation  during 6 min walk 91 %     Max Ex. HR 87 bpm     Max Ex. BP 148/76     2 Minute Post BP 150/74  Interval HR   1 Minute HR 82     2 Minute HR 78     3 Minute HR 79     4 Minute HR 79     5 Minute HR 79     6 Minute HR 87     2 Minute Post HR 67     Interval Heart Rate? Yes       Interval Oxygen   Interval Oxygen? Yes     Baseline Oxygen Saturation % 92 %     1 Minute Oxygen Saturation % 93 %     1 Minute Liters of Oxygen 0 L     2 Minute Oxygen Saturation % 93 %     2 Minute Liters of Oxygen 0 L     3 Minute Oxygen Saturation % 92 %     3 Minute Liters of Oxygen 0 L     4 Minute Oxygen Saturation % 91 %     4 Minute Liters of Oxygen 0 L     5 Minute Oxygen Saturation % 91 %     5 Minute Liters of Oxygen 0 L     6 Minute Oxygen Saturation % 91 %     6 Minute Liters of Oxygen 0 L     2 Minute Post Oxygen Saturation % 97 %     2 Minute Post Liters of Oxygen 0 L              Oxygen Initial Assessment:  Oxygen Initial Assessment - 03/11/24 1324       Home Oxygen   Home Oxygen Device None    Sleep Oxygen Prescription CPAP    Home Exercise Oxygen Prescription None    Home Resting Oxygen Prescription None      Initial 6 min Walk   Oxygen Used None      Program Oxygen Prescription   Program Oxygen Prescription None      Intervention   Short Term Goals To learn and understand importance of monitoring SPO2 with pulse oximeter and demonstrate accurate use of the pulse oximeter.;To learn and understand importance of maintaining oxygen saturations>88%;To learn and demonstrate proper pursed lip breathing techniques or other breathing techniques. ;To learn and demonstrate proper use of respiratory medications    Long  Term Goals Maintenance of O2 saturations>88%;Compliance with respiratory medication;Verbalizes importance of monitoring SPO2 with pulse oximeter and return demonstration;Exhibits proper breathing techniques, such as pursed lip  breathing or other method taught during program session;Demonstrates proper use of MDI's             Oxygen Re-Evaluation:   Oxygen Discharge (Final Oxygen Re-Evaluation):   Initial Exercise Prescription:  Initial Exercise Prescription - 03/11/24 1400       Date of Initial Exercise RX and Referring Provider   Date 03/11/24    Referring Provider Vivia Budge   Expected Discharge Date 06/08/24      Treadmill   MPH 2    Grade 0    Minutes 15    METs 2.54      Elliptical   Level 1    Speed 1    Minutes 15    METs 2.5      Prescription Details   Frequency (times per week) 2    Duration Progress to 30 minutes of continuous aerobic without signs/symptoms of physical distress      Intensity   THRR 40-80% of Max Heartrate 60-121    Ratings of  Perceived Exertion 11-13    Perceived Dyspnea 0-4      Progression   Progression Continue to progress workloads to maintain intensity without signs/symptoms of physical distress.      Resistance Training   Training Prescription Yes    Weight blue bands    Reps 10-15             Perform Capillary Blood Glucose checks as needed.  Exercise Prescription Changes:   Exercise Comments:   Exercise Goals and Review:   Exercise Goals     Row Name 03/11/24 1323             Exercise Goals   Increase Physical Activity Yes       Intervention Provide advice, education, support and counseling about physical activity/exercise needs.;Develop an individualized exercise prescription for aerobic and resistive training based on initial evaluation findings, risk stratification, comorbidities and participant's personal goals.       Expected Outcomes Short Term: Attend rehab on a regular basis to increase amount of physical activity.;Long Term: Add in home exercise to make exercise part of routine and to increase amount of physical activity.;Long Term: Exercising regularly at least 3-5 days a week.       Increase Strength and  Stamina Yes       Intervention Provide advice, education, support and counseling about physical activity/exercise needs.;Develop an individualized exercise prescription for aerobic and resistive training based on initial evaluation findings, risk stratification, comorbidities and participant's personal goals.       Expected Outcomes Short Term: Increase workloads from initial exercise prescription for resistance, speed, and METs.;Short Term: Perform resistance training exercises routinely during rehab and add in resistance training at home;Long Term: Improve cardiorespiratory fitness, muscular endurance and strength as measured by increased METs and functional capacity ( )       Able to understand and use rate of perceived exertion (RPE) scale Yes       Intervention Provide education and explanation on how to use RPE scale       Expected Outcomes Short Term: Able to use RPE daily in rehab to express subjective intensity level;Long Term:  Able to use RPE to guide intensity level when exercising independently       Able to understand and use Dyspnea scale Yes       Intervention Provide education and explanation on how to use Dyspnea scale       Expected Outcomes Short Term: Able to use Dyspnea scale daily in rehab to express subjective sense of shortness of breath during exertion;Long Term: Able to use Dyspnea scale to guide intensity level when exercising independently       Knowledge and understanding of Target Heart Rate Range (THRR) Yes       Intervention Provide education and explanation of THRR including how the numbers were predicted and where they are located for reference       Expected Outcomes Short Term: Able to state/look up THRR;Long Term: Able to use THRR to govern intensity when exercising independently;Short Term: Able to use daily as guideline for intensity in rehab       Understanding of Exercise Prescription Yes       Intervention Provide education, explanation, and written  materials on patient's individual exercise prescription       Expected Outcomes Long Term: Able to explain home exercise prescription to exercise independently;Short Term: Able to explain program exercise prescription  Exercise Goals Re-Evaluation :   Discharge Exercise Prescription (Final Exercise Prescription Changes):   Nutrition:  Target Goals: Understanding of nutrition guidelines, daily intake of sodium 1500mg , cholesterol 200mg , calories 30% from fat and 7% or less from saturated fats, daily to have 5 or more servings of fruits and vegetables.  Biometrics:  Pre Biometrics - 03/11/24 1247       Pre Biometrics   Grip Strength 20 kg              Nutrition Therapy Plan and Nutrition Goals:   Nutrition Assessments:  MEDIFICTS Score Key: >=70 Need to make dietary changes  40-70 Heart Healthy Diet <= 40 Therapeutic Level Cholesterol Diet   Picture Your Plate Scores: <41 Unhealthy dietary pattern with much room for improvement. 41-50 Dietary pattern unlikely to meet recommendations for good health and room for improvement. 51-60 More healthful dietary pattern, with some room for improvement.  >60 Healthy dietary pattern, although there may be some specific behaviors that could be improved.    Nutrition Goals Re-Evaluation:   Nutrition Goals Discharge (Final Nutrition Goals Re-Evaluation):   Psychosocial: Target Goals: Acknowledge presence or absence of significant depression and/or stress, maximize coping skills, provide positive support system. Participant is able to verbalize types and ability to use techniques and skills needed for reducing stress and depression.  Initial Review & Psychosocial Screening:  Initial Psych Review & Screening - 03/11/24 1318       Initial Review   Current issues with History of Depression;Current Anxiety/Panic;Current Psychotropic Meds;Current Stress Concerns    Source of Stress Concerns Chronic Illness     Comments Pt states he feels stressed about his health issues. He also states he is in a new marriage and somedays it is "challenging".      Family Dynamics   Good Support System? Yes      Barriers   Psychosocial barriers to participate in program The patient should benefit from training in stress management and relaxation.      Screening Interventions   Interventions Encouraged to exercise;To provide support and resources with identified psychosocial needs    Expected Outcomes Short Term goal: Utilizing psychosocial counselor, staff and physician to assist with identification of specific Stressors or current issues interfering with healing process. Setting desired goal for each stressor or current issue identified.;Long Term Goal: Stressors or current issues are controlled or eliminated.             Quality of Life Scores:  Scores of 19 and below usually indicate a poorer quality of life in these areas.  A difference of  2-3 points is a clinically meaningful difference.  A difference of 2-3 points in the total score of the Quality of Life Index has been associated with significant improvement in overall quality of life, self-image, physical symptoms, and general health in studies assessing change in quality of life.  PHQ-9: Review Flowsheet       03/11/2024  Depression screen PHQ 2/9  Decreased Interest 0  Down, Depressed, Hopeless 0  PHQ - 2 Score 0  Altered sleeping 0  Tired, decreased energy 0  Change in appetite 0  Feeling bad or failure about yourself  0  Trouble concentrating 0  Moving slowly or fidgety/restless 0  Suicidal thoughts 0  PHQ-9 Score 0  Difficult doing work/chores Not difficult at all   Interpretation of Total Score  Total Score Depression Severity:  1-4 = Minimal depression, 5-9 = Mild depression, 10-14 = Moderate depression, 15-19 = Moderately  severe depression, 20-27 = Severe depression   Psychosocial Evaluation and Intervention:  Psychosocial  Evaluation - 03/11/24 1346       Psychosocial Evaluation & Interventions   Interventions Stress management education;Relaxation education;Encouraged to exercise with the program and follow exercise prescription    Comments Pt states he feels stressed about his health issues. He also states he is in a new marriage and somedays it is "challenging".    Expected Outcomes For Ed to have less stress and anxiety while participating in pulmonary rehab.    Continue Psychosocial Services  No Follow up required             Psychosocial Re-Evaluation:   Psychosocial Discharge (Final Psychosocial Re-Evaluation):   Education: Education Goals: Education classes will be provided on a weekly basis, covering required topics. Participant will state understanding/return demonstration of topics presented.  Learning Barriers/Preferences:  Learning Barriers/Preferences - 03/11/24 1319       Learning Barriers/Preferences   Learning Barriers None    Learning Preferences Group Instruction;Individual Instruction;Written Material             Education Topics: Know Your Numbers Group instruction that is supported by a PowerPoint presentation. Instructor discusses importance of knowing and understanding resting, exercise, and post-exercise oxygen saturation, heart rate, and blood pressure. Oxygen saturation, heart rate, blood pressure, rating of perceived exertion, and dyspnea are reviewed along with a normal range for these values.    Exercise for the Pulmonary Patient Group instruction that is supported by a PowerPoint presentation. Instructor discusses benefits of exercise, core components of exercise, frequency, duration, and intensity of an exercise routine, importance of utilizing pulse oximetry during exercise, safety while exercising, and options of places to exercise outside of rehab.    MET Level  Group instruction provided by PowerPoint, verbal discussion, and written material to support  subject matter. Instructor reviews what METs are and how to increase METs.    Pulmonary Medications Verbally interactive group education provided by instructor with focus on inhaled medications and proper administration.   Anatomy and Physiology of the Respiratory System Group instruction provided by PowerPoint, verbal discussion, and written material to support subject matter. Instructor reviews respiratory cycle and anatomical components of the respiratory system and their functions. Instructor also reviews differences in obstructive and restrictive respiratory diseases with examples of each.    Oxygen Safety Group instruction provided by PowerPoint, verbal discussion, and written material to support subject matter. There is an overview of "What is Oxygen" and "Why do we need it".  Instructor also reviews how to create a safe environment for oxygen use, the importance of using oxygen as prescribed, and the risks of noncompliance. There is a brief discussion on traveling with oxygen and resources the patient may utilize.   Oxygen Use Group instruction provided by PowerPoint, verbal discussion, and written material to discuss how supplemental oxygen is prescribed and different types of oxygen supply systems. Resources for more information are provided.    Breathing Techniques Group instruction that is supported by demonstration and informational handouts. Instructor discusses the benefits of pursed lip and diaphragmatic breathing and detailed demonstration on how to perform both.     Risk Factor Reduction Group instruction that is supported by a PowerPoint presentation. Instructor discusses the definition of a risk factor, different risk factors for pulmonary disease, and how the heart and lungs work together.   Pulmonary Diseases Group instruction provided by PowerPoint, verbal discussion, and written material to support subject matter. Instructor gives an overview of  the different type  of pulmonary diseases. There is also a discussion on risk factors and symptoms as well as ways to manage the diseases.   Stress and Energy Conservation Group instruction provided by PowerPoint, verbal discussion, and written material to support subject matter. Instructor gives an overview of stress and the impact it can have on the body. Instructor also reviews ways to reduce stress. There is also a discussion on energy conservation and ways to conserve energy throughout the day.   Warning Signs and Symptoms Group instruction provided by PowerPoint, verbal discussion, and written material to support subject matter. Instructor reviews warning signs and symptoms of stroke, heart attack, cold and flu. Instructor also reviews ways to prevent the spread of infection.   Other Education Group or individual verbal, written, or video instructions that support the educational goals of the pulmonary rehab program.    Knowledge Questionnaire Score:  Knowledge Questionnaire Score - 03/11/24 1347       Knowledge Questionnaire Score   Pre Score 16/18             Core Components/Risk Factors/Patient Goals at Admission:  Personal Goals and Risk Factors at Admission - 03/11/24 1320       Core Components/Risk Factors/Patient Goals on Admission    Weight Management Yes;Weight Loss    Intervention Weight Management: Develop a combined nutrition and exercise program designed to reach desired caloric intake, while maintaining appropriate intake of nutrient and fiber, sodium and fats, and appropriate energy expenditure required for the weight goal.;Weight Management: Provide education and appropriate resources to help participant work on and attain dietary goals.;Obesity: Provide education and appropriate resources to help participant work on and attain dietary goals.;Weight Management/Obesity: Establish reasonable short term and long term weight goals.    Admit Weight 415 lb 12.6 oz (188.6 kg)     Expected Outcomes Short Term: Continue to assess and modify interventions until short term weight is achieved;Long Term: Adherence to nutrition and physical activity/exercise program aimed toward attainment of established weight goal;Weight Loss: Understanding of general recommendations for a balanced deficit meal plan, which promotes 1-2 lb weight loss per week and includes a negative energy balance of [phone] kcal/d;Understanding recommendations for meals to include 15-35% energy as protein, 25-35% energy from fat, 35-60% energy from carbohydrates, less than 200mg  of dietary cholesterol, 20-35 gm of total fiber daily;Understanding of distribution of calorie intake throughout the day with the consumption of 4-5 meals/snacks    Improve shortness of breath with ADL's Yes    Intervention Provide education, individualized exercise plan and daily activity instruction to help decrease symptoms of SOB with activities of daily living.    Expected Outcomes Short Term: Improve cardiorespiratory fitness to achieve a reduction of symptoms when performing ADLs;Long Term: Be able to perform more ADLs without symptoms or delay the onset of symptoms             Core Components/Risk Factors/Patient Goals Review:    Core Components/Risk Factors/Patient Goals at Discharge (Final Review):    ITP Comments:   Comments: Dr. Mechele Collin is Medical Director for Pulmonary Rehab at [facility].

## 2024-03-11 NOTE — Progress Notes (Signed)
 Peter Lewis 68 y.o. male  Initial Psychosocial Assessment  Pt psychosocial assessment reveals pt lives with their spouse. Pt is currently full time job working at [employer]. Pt hobbies include  fishing . Pt reports his  stress level is low. Areas of stress/anxiety include health and family .  Pt does not exhibit signs of depression. Pt shows good  coping skills with positive outlook . Offered emotional support and reassurance. Monitor and evaluate progress toward psychosocial goal(s).  Goal(s): Improved management of stress and anxiety Improved coping skills Help patient work toward returning to meaningful activities that improve patient's QOL and are attainable with patient's lung disease   03/11/2024 1:53 PM

## 2024-03-13 NOTE — Progress Notes (Deleted)
 Peter Lewis, male    DOB: 1956-11-07    MRN: [id number]   Brief patient profile:  36  yobm  never smoker/ works at [employer] referred to pulmonary clinic in [place]  01/18/2024 by Dr Margo Aye  for doe/cough x winter of 2024    [facility]  [facility]    History of Present Illness  01/18/2024  Pulmonary/ 1st office eval/ Sherene Sires / [facility] /[facility] maint Chief Complaint  Patient presents with   Consult  Dyspnea:  still able to walk around warehouse / is able to do [employer] / uses [facility] parking   Cough: esp at hs white mucus Sleep: bed is flat 3 pillows wakes him up 3-4   q  noct then sleeps in recliner  SABA use: 3-4 x per day  02 L  none  Rec Plan A = Automatic = Always=    Symbicort 80 (Breyna) Take 2 puffs first thing in am and then another 2 puffs about 12 hours later.  Work on inhaler technique:    Plan B = Backup (to supplement plan A, not to replace it) Only use your albuterol inhaler as a rescue medication Please schedule a follow up office visit in 6 weeks, call sooner if needed with all medications /inhalers/ solutions in hand       03/16/2024  f/u ov/[place] [facility]/Anselma Herbel re: *** maint on ***  No chief complaint on file.   Dyspnea:  *** Cough: *** Sleeping: ***   resp cc  SABA use: *** 02: ***  Lung cancer screening: ***   No obvious day to day or daytime variability or assoc excess/ purulent sputum or mucus plugs or hemoptysis or cp or chest tightness, subjective wheeze or overt sinus or hb symptoms.    Also denies any obvious fluctuation of symptoms with weather or environmental changes or other aggravating or alleviating factors except as outlined above   No unusual exposure hx or h/o childhood pna/ asthma or knowledge of premature birth.  Current Allergies, Complete Past Medical History, Past Surgical History, Family History, and Social History were reviewed in [employer] record.  ROS  The following are not active  complaints unless bolded Hoarseness, sore throat, dysphagia, dental problems, itching, sneezing,  nasal congestion or discharge of excess mucus or purulent secretions, ear ache,   fever, chills, sweats, unintended wt loss or wt gain, classically pleuritic or exertional cp,  orthopnea pnd or arm/hand swelling  or leg swelling, presyncope, palpitations, abdominal pain, anorexia, nausea, vomiting, diarrhea  or change in bowel habits or change in bladder habits, change in stools or change in urine, dysuria, hematuria,  rash, arthralgias, visual complaints, headache, numbness, weakness or ataxia or problems with walking or coordination,  change in mood or  memory.        No outpatient medications have been marked as taking for the 03/16/24 encounter (Appointment) with Nyoka Cowden, MD.            Past Medical History:  Diagnosis Date   Arthritis    Asthma    Diabetes mellitus without complication (HCC)    GERD (gastroesophageal reflux disease)    Sleep apnea       Objective:    Wts  03/16/2024          ***   03/11/24 261 lb 7.5 oz (118.6 kg)  01/18/24 261 lb 3.2 oz (118.5 kg)  11/24/23 235 lb (106.6 kg)      Vital  signs reviewed  03/16/2024  - Note at rest 02 sats  ***% on ***   General appearance:    ***      Quite protuberant/obese *** Assessment

## 2024-03-16 ENCOUNTER — Encounter: Payer: Self-pay | Admitting: Internal Medicine

## 2024-03-16 ENCOUNTER — Ambulatory Visit: Admitting: Internal Medicine

## 2024-03-16 DIAGNOSIS — J441 Chronic obstructive pulmonary disease with (acute) exacerbation: Secondary | ICD-10-CM | POA: Insufficient documentation

## 2024-03-16 DIAGNOSIS — L603 Nail dystrophy: Secondary | ICD-10-CM | POA: Insufficient documentation

## 2024-03-17 ENCOUNTER — Telehealth (HOSPITAL_COMMUNITY): Payer: Self-pay

## 2024-03-17 ENCOUNTER — Encounter (HOSPITAL_COMMUNITY): Admission: RE | Admit: 2024-03-17 | Source: Ambulatory Visit

## 2024-03-17 NOTE — Telephone Encounter (Signed)
 Called pt to see why he missed PR. No answer. Voicemail mailbox was full.

## 2024-03-22 ENCOUNTER — Encounter (HOSPITAL_COMMUNITY)
Admission: RE | Admit: 2024-03-22 | Discharge: 2024-03-22 | Disposition: A | Discharge status: Home / Self Care | Source: Ambulatory Visit | Attending: Pulmonary Disease | Admitting: Pulmonary Disease

## 2024-03-22 DIAGNOSIS — J449 Chronic obstructive pulmonary disease, unspecified: Secondary | ICD-10-CM | POA: Insufficient documentation

## 2024-03-22 LAB — GLUCOSE, CAPILLARY: Glucose-Capillary: 154 mg/dL — ABNORMAL HIGH (ref 70–99)

## 2024-03-22 NOTE — Progress Notes (Signed)
 Daily Session Note  Patient Details  Name: Peter Lewis MRN: [id number] Date of Birth: [birth year] Referring Provider:   Doristine Devoid Pulmonary Rehab Walk Test from 03/11/2024 in [facility]  Referring Provider Briones  [Ellison]       Encounter Date: 03/22/2024  Check In:  Session Check In - 03/22/24 1334       Check-In   Supervising physician immediately available to respond to emergencies [facility] MD immediately available    Physician(s) Rise Paganini, NP    Location [facility]    Staff Present Raford Pitcher, MS, ACSM-CEP, Exercise Physiologist;Mary Gerre Scull, RN, BSN;Casey Katrinka Blazing, RT;Lorence Nagengast BS, ACSM-CEP, Exercise Physiologist;Samantha Belarus, RD, LDN    Virtual Visit No    Medication changes reported     No    Fall or balance concerns reported    Yes    Comments recent fall    Tobacco Cessation No Change    Warm-up and Cool-down Performed as group-led instruction    Resistance Training Performed Yes    VAD Patient? No    PAD/SET Patient? No      Pain Assessment   Currently in Pain? No/denies    Multiple Pain Sites No             Capillary Blood Glucose: Results for orders placed or performed during the hospital encounter of 03/11/24 (from the past 24 hours)  Glucose, capillary     Status: Abnormal   Collection Time: 03/22/24  1:22 PM  Result Value Ref Range   Glucose-Capillary 154 (H) 70 - 99 mg/dL      Social History   Tobacco Use  Smoking Status Never  Smokeless Tobacco Never    Goals Met:  Exercise tolerated well No report of concerns or symptoms today Strength training completed today  Goals Unmet:  Not Applicable  Comments: Service time is from 1315 to 1453.    Dr. Mechele Collin is Medical Director for Pulmonary Rehab at [facility].

## 2024-03-23 LAB — GLUCOSE, CAPILLARY: Glucose-Capillary: 102 mg/dL — ABNORMAL HIGH (ref 70–99)

## 2024-03-24 ENCOUNTER — Encounter (HOSPITAL_COMMUNITY)
Admission: RE | Admit: 2024-03-24 | Discharge: 2024-03-24 | Discharge status: Home / Self Care | Source: Ambulatory Visit | Attending: Pulmonary Disease

## 2024-03-24 VITALS — Wt 256.2 lb

## 2024-03-24 DIAGNOSIS — J449 Chronic obstructive pulmonary disease, unspecified: Secondary | ICD-10-CM | POA: Diagnosis not present

## 2024-03-24 LAB — GLUCOSE, CAPILLARY
Glucose-Capillary: 113 mg/dL — ABNORMAL HIGH (ref 70–99)
Glucose-Capillary: 108 mg/dL — ABNORMAL HIGH (ref 70–99)

## 2024-03-24 NOTE — Progress Notes (Signed)
 Daily Session Note  Patient Details  Name: Peter Lewis MRN: [id number] Date of Birth: 1956-03-23 Referring Provider:   Doristine Devoid Pulmonary Rehab Walk Test from 03/11/2024 in [facility]  Referring Provider Briones  [Ellison]       Encounter Date: 03/24/2024  Check In:  Session Check In - 03/24/24 1333       Check-In   Supervising physician immediately available to respond to emergencies [facility] MD immediately available    Physician(s) Robin Searing, NP    [facility]    Staff Present Raford Pitcher, MS, ACSM-CEP, Exercise Physiologist;Mary Gerre Scull, RN, BSN;Vernis Eid Katrinka Blazing, RT;Randi Reeve BS, ACSM-CEP, Exercise Physiologist;Samantha Belarus, RD, LDN    Virtual Visit No    Medication changes reported     No    Fall or balance concerns reported    Yes    Comments recent fall    Tobacco Cessation No Change    Warm-up and Cool-down Performed as group-led instruction    Resistance Training Performed Yes    VAD Patient? No    PAD/SET Patient? No      Pain Assessment   Currently in Pain? No/denies    Multiple Pain Sites No             Capillary Blood Glucose: Results for orders placed or performed during the hospital encounter of 03/24/24 (from the past 24 hours)  Glucose, capillary     Status: Abnormal   Collection Time: 03/24/24  2:51 PM  Result Value Ref Range   Glucose-Capillary 108 (H) 70 - 99 mg/dL      Social History   Tobacco Use  Smoking Status Never  Smokeless Tobacco Never    Goals Met:  Proper associated with RPD/PD & O2 Sat Independence with exercise equipment Exercise tolerated well No report of concerns or symptoms today Strength training completed today  Goals Unmet:  Not Applicable  Comments: Service time is from 1320 to 1455.    Dr. Mechele Collin is Medical Director for Pulmonary Rehab at [facility].

## 2024-03-24 NOTE — Progress Notes (Deleted)
 Daily Session Note  Patient Details  Name: Peter Lewis MRN: [id number] Date of Birth: February 01, 1956 Referring Provider:   Doristine Devoid Pulmonary Rehab Walk Test from 03/11/2024 in [facility]  Referring Provider Briones  [Ellison]       Encounter Date: 03/24/2024  Check In:  Session Check In - 03/24/24 1333       Check-In   Supervising physician immediately available to respond to emergencies [facility] MD immediately available    Physician(s) Robin Searing, NP    Location [facility]    Staff Present Raford Pitcher, MS, ACSM-CEP, Exercise Physiologist;Mary Gerre Scull, RN, BSN;Casey Katrinka Blazing, RT;Randi Reeve BS, ACSM-CEP, Exercise Physiologist;Samantha Belarus, RD, LDN    Virtual Visit No    Medication changes reported     No    Fall or balance concerns reported    Yes    Comments recent fall    Tobacco Cessation No Change    Warm-up and Cool-down Performed as group-led instruction    Resistance Training Performed Yes    VAD Patient? No    PAD/SET Patient? No      Pain Assessment   Currently in Pain? No/denies    Multiple Pain Sites No             Capillary Blood Glucose: Results for orders placed or performed during the hospital encounter of 03/24/24 (from the past 24 hours)  Glucose, capillary     Status: Abnormal   Collection Time: 03/24/24  2:51 PM  Result Value Ref Range   Glucose-Capillary 108 (H) 70 - 99 mg/dL      Social History   Tobacco Use  Smoking Status Never  Smokeless Tobacco Never    Goals Met:  Proper associated with RPD/PD & O2 Sat Exercise tolerated well No report of concerns or symptoms today Strength training completed today  Goals Unmet:  Not Applicable  Comments: Service time is from 1336 to 1450.    Dr. Mechele Collin is Medical Director for Pulmonary Rehab at [facility]

## 2024-03-29 ENCOUNTER — Telehealth (HOSPITAL_COMMUNITY): Payer: Self-pay

## 2024-03-29 ENCOUNTER — Encounter (HOSPITAL_COMMUNITY): Admission: RE | Admit: 2024-03-29 | Source: Ambulatory Visit

## 2024-03-29 NOTE — Telephone Encounter (Signed)
 Patient came by front desk to let us  know [facility] and won't be in to class today.

## 2024-03-30 NOTE — Progress Notes (Signed)
 Pulmonary Individual Treatment Plan  Patient Details  Name: Peter Lewis MRN: [id number] Date of Birth: 1956-08-28 Referring Provider:   Doristine Devoid Pulmonary Rehab Walk Test from 03/11/2024 in [facility]  Referring Provider Briones  [Ellison]       Initial Encounter Date:  Flowsheet Row Pulmonary Rehab Walk Test from 03/11/2024 in [facility]  Date 03/11/24       Visit Diagnosis: Chronic obstructive pulmonary disease, unspecified COPD type (HCC)  Patient's Home Medications on Admission:   Current Outpatient Medications:    acetaminophen (TYLENOL) 650 MG CR tablet, Take 1,300 mg by mouth every 8 (eight) hours as needed for pain., Disp: , Rfl:    albuterol (PROAIR HFA) 108 (90 Base) MCG/ACT inhaler, 2 puffs every 4 hours as needed only  if your can't catch your breath, Disp: 18 g, Rfl: 11   aspirin EC 81 MG tablet, Take 81 mg by mouth daily., Disp: , Rfl:    atorvastatin (LIPITOR) 20 MG tablet, Take by mouth., Disp: , Rfl:    budesonide-formoterol (SYMBICORT) 80-4.5 MCG/ACT inhaler, Take 2 puffs first thing in am and then another 2 puffs about 12 hours later., Disp: 1 each, Rfl: 12   Carboxymethylcellulose Sod PF 0.5 % SOLN, Apply to eye., Disp: , Rfl:    cetirizine (ZYRTEC) 10 MG tablet, Take 10 mg by mouth daily., Disp: , Rfl:    empagliflozin (JARDIANCE) 25 MG TABS tablet, Take 1 tablet (25 mg total) by mouth daily., Disp: , Rfl:    fluticasone (FLONASE) 50 MCG/ACT nasal spray, Place into the nose., Disp: , Rfl:    ibuprofen (ADVIL,MOTRIN) 200 MG tablet, Take 600 mg by mouth every 6 (six) hours as needed for moderate pain., Disp: , Rfl:    ketotifen (ZADITOR) 0.035 % ophthalmic solution, Apply to eye., Disp: , Rfl:    losartan (COZAAR) 100 MG tablet, Take 0.5 tablets by mouth daily., Disp: , Rfl:    metFORMIN (GLUCOPHAGE) 500 MG tablet, Take 500 mg by mouth 2  (two) times daily with a meal., Disp: , Rfl:    montelukast (SINGULAIR) 10 MG tablet, Take 10 mg by mouth., Disp: , Rfl:    omeprazole (PRILOSEC) 20 MG capsule, Take by mouth., Disp: , Rfl:    sertraline (ZOLOFT) 100 MG tablet, Take by mouth., Disp: , Rfl:    tadalafil (CIALIS) 20 MG tablet, Take by mouth., Disp: , Rfl:    traZODone (DESYREL) 50 MG tablet, Take by mouth., Disp: , Rfl:    TRELEGY ELLIPTA 100-62.5-25 MCG/ACT AEPB, Inhale 1 puff into the lungs daily., Disp: , Rfl:   Past Medical History: Past Medical History:  Diagnosis Date   Arthritis    Asthma    Diabetes mellitus without complication (HCC)    GERD (gastroesophageal reflux disease)    Hyperlipidemia    Hypertension    Sleep apnea     Tobacco Use: Social History   Tobacco Use  Smoking Status Never  Smokeless Tobacco Never    Labs: Review Flowsheet       Latest Ref Rng & Units 06/22/2015 05/07/2018  Labs for ITP Cardiac and Pulmonary Rehab  Hemoglobin A1c 4.8 - 5.6 % - 5.4   TCO2 0 - 100 mmol/L 26  -    Capillary Blood Glucose: Lab Results  Component Value Date   GLUCAP 108 (H) 03/24/2024   GLUCAP 113 (H) 03/24/2024  GLUCAP 102 (H) 03/22/2024   GLUCAP 154 (H) 03/22/2024   GLUCAP 116 (H) 05/14/2018     Pulmonary Assessment Scores:  Pulmonary Assessment Scores     Row Name 03/11/24 1349         ADL UCSD   ADL Phase Entry     SOB Score total 61       CAT Score   CAT Score 21       mMRC Score   mMRC Score 2             UCSD: Self-administered rating of dyspnea associated with activities of daily living (ADLs) 6-point scale (0 = "not at all" to 5 = "maximal or unable to do because of breathlessness")  Scoring Scores range from 0 to 120.  Minimally important difference is 5 units  CAT: CAT can identify the health impairment of COPD patients and is better correlated with disease progression.  CAT has a scoring range of zero to 40. The CAT score is classified into four groups of low  (less than 10), medium (10 - 20), high (21-30) and very high (31-40) based on the impact level of disease on health status. A CAT score over 10 suggests significant symptoms.  A worsening CAT score could be explained by an exacerbation, poor medication adherence, poor inhaler technique, or progression of COPD or comorbid conditions.  CAT MCID is 2 points  mMRC: mMRC (Modified Medical Research Council) Dyspnea Scale is used to assess the degree of baseline functional disability in patients of respiratory disease due to dyspnea. No minimal important difference is established. A decrease in score of 1 point or greater is considered a positive change.   Pulmonary Function Assessment:  Pulmonary Function Assessment - 03/11/24 1349       Breath   Bilateral Breath Sounds Clear    Shortness of Breath Yes;Limiting activity             Exercise Target Goals: Exercise Program Goal: Individual exercise prescription set using results from initial 6 min walk test and THRR while considering  patient's activity barriers and safety.   Exercise Prescription Goal: Initial exercise prescription builds to 30-45 minutes a day of aerobic activity, 2-3 days per week.  Home exercise guidelines will be given to patient during program as part of exercise prescription that the participant will acknowledge.  Activity Barriers & Risk Stratification:  Activity Barriers & Cardiac Risk Stratification - 03/11/24 1321       Activity Barriers & Cardiac Risk Stratification   Activity Barriers Neck/Spine Problems;Arthritis;Back Problems;Joint Problems;Deconditioning;Muscular Weakness;Shortness of Breath;History of Falls    Cardiac Risk Stratification Moderate             6 Minute Walk:  6 Minute Walk     Row Name 03/11/24 1413         6 Minute Walk   Phase Initial     Distance 1024 feet     Walk Time 6 minutes     # of Rest Breaks 0     MPH 1.94     METS 1.56     RPE 11     Perceived Dyspnea  1      VO2 Peak 5.45     Symptoms No     Resting HR 87 bpm     Resting BP 148/76     Resting Oxygen Saturation  97 %     Exercise Oxygen Saturation  during 6 min walk 91 %  Max Ex. HR 87 bpm     Max Ex. BP 148/76     2 Minute Post BP 150/74       Interval HR   1 Minute HR 82     2 Minute HR 78     3 Minute HR 79     4 Minute HR 79     5 Minute HR 79     6 Minute HR 87     2 Minute Post HR 67     Interval Heart Rate? Yes       Interval Oxygen   Interval Oxygen? Yes     Baseline Oxygen Saturation % 92 %     1 Minute Oxygen Saturation % 93 %     1 Minute Liters of Oxygen 0 L     2 Minute Oxygen Saturation % 93 %     2 Minute Liters of Oxygen 0 L     3 Minute Oxygen Saturation % 92 %     3 Minute Liters of Oxygen 0 L     4 Minute Oxygen Saturation % 91 %     4 Minute Liters of Oxygen 0 L     5 Minute Oxygen Saturation % 91 %     5 Minute Liters of Oxygen 0 L     6 Minute Oxygen Saturation % 91 %     6 Minute Liters of Oxygen 0 L     2 Minute Post Oxygen Saturation % 97 %     2 Minute Post Liters of Oxygen 0 L              Oxygen Initial Assessment:  Oxygen Initial Assessment - 03/11/24 1324       Home Oxygen   Home Oxygen Device None    Sleep Oxygen Prescription CPAP    Home Exercise Oxygen Prescription None    Home Resting Oxygen Prescription None      Initial 6 min Walk   Oxygen Used None      Program Oxygen Prescription   Program Oxygen Prescription None      Intervention   Short Term Goals To learn and understand importance of monitoring SPO2 with pulse oximeter and demonstrate accurate use of the pulse oximeter.;To learn and understand importance of maintaining oxygen saturations>88%;To learn and demonstrate proper pursed lip breathing techniques or other breathing techniques. ;To learn and demonstrate proper use of respiratory medications    Long  Term Goals Maintenance of O2 saturations>88%;Compliance with respiratory medication;Verbalizes importance of  monitoring SPO2 with pulse oximeter and return demonstration;Exhibits proper breathing techniques, such as pursed lip breathing or other method taught during program session;Demonstrates proper use of MDI's             Oxygen Re-Evaluation:  Oxygen Re-Evaluation     Row Name 03/25/24 0938             Program Oxygen Prescription   Program Oxygen Prescription None         Home Oxygen   Home Oxygen Device None       Sleep Oxygen Prescription CPAP       Home Exercise Oxygen Prescription None       Home Resting Oxygen Prescription None         Goals/Expected Outcomes   Short Term Goals To learn and understand importance of monitoring SPO2 with pulse oximeter and demonstrate accurate use of the pulse oximeter.;To learn and understand importance of maintaining oxygen saturations>88%;To learn  and demonstrate proper pursed lip breathing techniques or other breathing techniques. ;To learn and demonstrate proper use of respiratory medications       Long  Term Goals Maintenance of O2 saturations>88%;Compliance with respiratory medication;Verbalizes importance of monitoring SPO2 with pulse oximeter and return demonstration;Exhibits proper breathing techniques, such as pursed lip breathing or other method taught during program session;Demonstrates proper use of MDI's       Goals/Expected Outcomes Compliance and understanding of oxygen saturation monitoring and breath techniques to decrease shortness of breath.                Oxygen Discharge (Final Oxygen Re-Evaluation):  Oxygen Re-Evaluation - 03/25/24 0938       Program Oxygen Prescription   Program Oxygen Prescription None      Home Oxygen   Home Oxygen Device None    Sleep Oxygen Prescription CPAP    Home Exercise Oxygen Prescription None    Home Resting Oxygen Prescription None      Goals/Expected Outcomes   Short Term Goals To learn and understand importance of monitoring SPO2 with pulse oximeter and demonstrate accurate  use of the pulse oximeter.;To learn and understand importance of maintaining oxygen saturations>88%;To learn and demonstrate proper pursed lip breathing techniques or other breathing techniques. ;To learn and demonstrate proper use of respiratory medications    Long  Term Goals Maintenance of O2 saturations>88%;Compliance with respiratory medication;Verbalizes importance of monitoring SPO2 with pulse oximeter and return demonstration;Exhibits proper breathing techniques, such as pursed lip breathing or other method taught during program session;Demonstrates proper use of MDI's    Goals/Expected Outcomes Compliance and understanding of oxygen saturation monitoring and breath techniques to decrease shortness of breath.             Initial Exercise Prescription:  Initial Exercise Prescription - 03/11/24 1400       Date of Initial Exercise RX and Referring Provider   Date 03/11/24    Referring Provider Vivia Budge   Expected Discharge Date 06/08/24      Treadmill   MPH 2    Grade 0    Minutes 15    METs 2.54      Elliptical   Level 1    Speed 1    Minutes 15    METs 2.5      Prescription Details   Frequency (times per week) 2    Duration Progress to 30 minutes of continuous aerobic without signs/symptoms of physical distress      Intensity   THRR 40-80% of Max Heartrate 60-121    Ratings of Perceived Exertion 11-13    Perceived Dyspnea 0-4      Progression   Progression Continue to progress workloads to maintain intensity without signs/symptoms of physical distress.      Resistance Training   Training Prescription Yes    Weight blue bands    Reps 10-15             Perform Capillary Blood Glucose checks as needed.  Exercise Prescription Changes:   Exercise Prescription Changes     Row Name 03/24/24 1505             Response to Exercise   Blood Pressure (Admit) 130/62       Blood Pressure (Exit) 140/64       Heart Rate (Admit) 73 bpm       Heart  Rate (Exercise) 120 bpm       Heart Rate (Exit) 88 bpm  Oxygen Saturation (Admit) 95 %       Oxygen Saturation (Exercise) 90 %       Oxygen Saturation (Exit) 95 %       Rating of Perceived Exertion (Exercise) 12       Perceived Dyspnea (Exercise) 1       Duration Continue with 30 min of aerobic exercise without signs/symptoms of physical distress.       Intensity THRR unchanged         Resistance Training   Training Prescription Yes       Weight black bands       Reps 10-15       Time 10 Minutes         Treadmill   MPH 2.1       Grade 0       Minutes 15       METs 2.5         Elliptical   Level 1       Speed 1       Minutes 15       METs 4                Exercise Comments:   Exercise Comments     Row Name 03/22/24 1507           Exercise Comments Pt completed his first day of group exercise. He walked on the treadmill for 15 min, 2 mph, 0 incline, METs 2.4. He then exercised on the upright elliptical for 15 min level 1, incline 1, METs 3.9. Tolerated well although fatigued. Performed warm up and cool down without limitations. Discussed METs.                Exercise Goals and Review:   Exercise Goals     Row Name 03/11/24 1323             Exercise Goals   Increase Physical Activity Yes       Intervention Provide advice, education, support and counseling about physical activity/exercise needs.;Develop an individualized exercise prescription for aerobic and resistive training based on initial evaluation findings, risk stratification, comorbidities and participant's personal goals.       Expected Outcomes Short Term: Attend rehab on a regular basis to increase amount of physical activity.;Long Term: Add in home exercise to make exercise part of routine and to increase amount of physical activity.;Long Term: Exercising regularly at least 3-5 days a week.       Increase Strength and Stamina Yes       Intervention Provide advice, education, support and  counseling about physical activity/exercise needs.;Develop an individualized exercise prescription for aerobic and resistive training based on initial evaluation findings, risk stratification, comorbidities and participant's personal goals.       Expected Outcomes Short Term: Increase workloads from initial exercise prescription for resistance, speed, and METs.;Short Term: Perform resistance training exercises routinely during rehab and add in resistance training at home;Long Term: Improve cardiorespiratory fitness, muscular endurance and strength as measured by increased METs and functional capacity ( )       Able to understand and use rate of perceived exertion (RPE) scale Yes       Intervention Provide education and explanation on how to use RPE scale       Expected Outcomes Short Term: Able to use RPE daily in rehab to express subjective intensity level;Long Term:  Able to use RPE to guide intensity level when exercising independently  Able to understand and use Dyspnea scale Yes       Intervention Provide education and explanation on how to use Dyspnea scale       Expected Outcomes Short Term: Able to use Dyspnea scale daily in rehab to express subjective sense of shortness of breath during exertion;Long Term: Able to use Dyspnea scale to guide intensity level when exercising independently       Knowledge and understanding of Target Heart Rate Range (THRR) Yes       Intervention Provide education and explanation of THRR including how the numbers were predicted and where they are located for reference       Expected Outcomes Short Term: Able to state/look up THRR;Long Term: Able to use THRR to govern intensity when exercising independently;Short Term: Able to use daily as guideline for intensity in rehab       Understanding of Exercise Prescription Yes       Intervention Provide education, explanation, and written materials on patient's individual exercise prescription       Expected  Outcomes Long Term: Able to explain home exercise prescription to exercise independently;Short Term: Able to explain program exercise prescription                Exercise Goals Re-Evaluation :  Exercise Goals Re-Evaluation     Row Name 03/25/24 0935             Exercise Goal Re-Evaluation   Exercise Goals Review Increase Physical Activity;Able to understand and use Dyspnea scale;Understanding of Exercise Prescription;Increase Strength and Stamina;Knowledge and understanding of Target Heart Rate Range (THRR);Able to understand and use rate of perceived exertion (RPE) scale       Comments Pt has completed 2 exercise sessions. He is walking on the treadmill for 15 min, 2.1 mph, 0 incline, METs 2.5. He then is exercising on the upright elliptical for 15 min, level 1, incline 1, METs 4.0. The elliptical is challenging for him. Performs warm up and cool down without limitations, using blue bands (5.8 lbs). Will progress as tolerated.       Expected Outcomes Through exercise in rehab and at home, the patient will decrease shortness of breath with daily activities and feel confident in doing an exercise regimen at home.                Discharge Exercise Prescription (Final Exercise Prescription Changes):  Exercise Prescription Changes - 03/24/24 1505       Response to Exercise   Blood Pressure (Admit) 130/62    Blood Pressure (Exit) 140/64    Heart Rate (Admit) 73 bpm    Heart Rate (Exercise) 120 bpm    Heart Rate (Exit) 88 bpm    Oxygen Saturation (Admit) 95 %    Oxygen Saturation (Exercise) 90 %    Oxygen Saturation (Exit) 95 %    Rating of Perceived Exertion (Exercise) 12    Perceived Dyspnea (Exercise) 1    Duration Continue with 30 min of aerobic exercise without signs/symptoms of physical distress.    Intensity THRR unchanged      Resistance Training   Training Prescription Yes    Weight black bands    Reps 10-15    Time 10 Minutes      Treadmill   MPH 2.1    Grade  0    Minutes 15    METs 2.5      Elliptical   Level 1    Speed 1    Minutes 15  METs 4             Nutrition:  Target Goals: Understanding of nutrition guidelines, daily intake of sodium 1500mg , cholesterol 200mg , calories 30% from fat and 7% or less from saturated fats, daily to have 5 or more servings of fruits and vegetables.  Biometrics:  Pre Biometrics - 03/11/24 1247       Pre Biometrics   Grip Strength 20 kg              Nutrition Therapy Plan and Nutrition Goals:  Nutrition Therapy & Goals - 03/22/24 1432       Nutrition Therapy   Diet Heart Healthy/carbohydrate consistent diet    Drug/Food Interactions Statins/Certain Fruits      Personal Nutrition Goals   Nutrition Goal Patient to improve diet quality by using the plate method as a guide for meal planning to include lean protein/plant protein, fruits, vegetables, whole grains, nonfat dairy as part of a well-balanced diet    Personal Goal #2 Patient to identfiy strategies for weight loss of 0.5-2.0# per week.    Comments Patient has medical history of COPD3, HTN, OSA, DM2, hyperlipidemia. He is motivated to lose weight; will discuss multiple strategies for weight loss including calorie density, the plate method for meal planning, etc. Patient will benefit from participation in pulmonary rehab for nutrition, exericse, lifestyle modification support.      Intervention Plan   Intervention Prescribe, educate and counsel regarding individualized specific dietary modifications aiming towards targeted core components such as weight, hypertension, lipid management, diabetes, heart failure and other comorbidities.;Nutrition handout(s) given to patient.    Expected Outcomes Short Term Goal: Understand basic principles of dietary content, such as calories, fat, sodium, cholesterol and nutrients.;Long Term Goal: Adherence to prescribed nutrition plan.             Nutrition Assessments:  MEDIFICTS Score  Key: >=70 Need to make dietary changes  40-70 Heart Healthy Diet <= 40 Therapeutic Level Cholesterol Diet   Picture Your Plate Scores: <57 Unhealthy dietary pattern with much room for improvement. 41-50 Dietary pattern unlikely to meet recommendations for good health and room for improvement. 51-60 More healthful dietary pattern, with some room for improvement.  >60 Healthy dietary pattern, although there may be some specific behaviors that could be improved.    Nutrition Goals Re-Evaluation:  Nutrition Goals Re-Evaluation     Row Name 03/22/24 1432             Goals   Current Weight 257 lb 0.9 oz (116.6 kg)       Comment A1c 7.4, LDL 101       Expected Outcome Patient has medical history of COPD3, HTN, OSA, DM2, hyperlipidemia. He is motivated to lose weight; will discuss multiple strategies for weight loss including calorie density, the plate method for meal planning, etc. Patient will benefit from participation in pulmonary rehab for nutrition, exericse, lifestyle modification support.                Nutrition Goals Discharge (Final Nutrition Goals Re-Evaluation):  Nutrition Goals Re-Evaluation - 03/22/24 1432       Goals   Current Weight 257 lb 0.9 oz (116.6 kg)    Comment A1c 7.4, LDL 101    Expected Outcome Patient has medical history of COPD3, HTN, OSA, DM2, hyperlipidemia. He is motivated to lose weight; will discuss multiple strategies for weight loss including calorie density, the plate method for meal planning, etc. Patient will benefit from participation in  pulmonary rehab for nutrition, exericse, lifestyle modification support.             Psychosocial: Target Goals: Acknowledge presence or absence of significant depression and/or stress, maximize coping skills, provide positive support system. Participant is able to verbalize types and ability to use techniques and skills needed for reducing stress and depression.  Initial Review & Psychosocial  Screening:  Initial Psych Review & Screening - 03/11/24 1318       Initial Review   Current issues with History of Depression;Current Anxiety/Panic;Current Psychotropic Meds;Current Stress Concerns    Source of Stress Concerns Chronic Illness    Comments Pt states he feels stressed about his health issues. He also states he is in a new marriage and somedays it is "challenging".      Family Dynamics   Good Support System? Yes      Barriers   Psychosocial barriers to participate in program The patient should benefit from training in stress management and relaxation.      Screening Interventions   Interventions Encouraged to exercise;To provide support and resources with identified psychosocial needs    Expected Outcomes Short Term goal: Utilizing psychosocial counselor, staff and physician to assist with identification of specific Stressors or current issues interfering with healing process. Setting desired goal for each stressor or current issue identified.;Long Term Goal: Stressors or current issues are controlled or eliminated.             Quality of Life Scores:  Scores of 19 and below usually indicate a poorer quality of life in these areas.  A difference of  2-3 points is a clinically meaningful difference.  A difference of 2-3 points in the total score of the Quality of Life Index has been associated with significant improvement in overall quality of life, self-image, physical symptoms, and general health in studies assessing change in quality of life.  PHQ-9: Review Flowsheet       03/11/2024  Depression screen PHQ 2/9  Decreased Interest 0  Down, Depressed, Hopeless 0  PHQ - 2 Score 0  Altered sleeping 0  Tired, decreased energy 0  Change in appetite 0  Feeling bad or failure about yourself  0  Trouble concentrating 0  Moving slowly or fidgety/restless 0  Suicidal thoughts 0  PHQ-9 Score 0  Difficult doing work/chores Not difficult at all   Interpretation of  Total Score  Total Score Depression Severity:  1-4 = Minimal depression, 5-9 = Mild depression, 10-14 = Moderate depression, 15-19 = Moderately severe depression, 20-27 = Severe depression   Psychosocial Evaluation and Intervention:  Psychosocial Evaluation - 03/11/24 1346       Psychosocial Evaluation & Interventions   Interventions Stress management education;Relaxation education;Encouraged to exercise with the program and follow exercise prescription    Comments Pt states he feels stressed about his health issues. He also states he is in a new marriage and somedays it is "challenging".    Expected Outcomes For Ed to have less stress and anxiety while participating in pulmonary rehab.    Continue Psychosocial Services  No Follow up required             Psychosocial Re-Evaluation:  Psychosocial Re-Evaluation     Row Name 03/23/24 1540             Psychosocial Re-Evaluation   Current issues with History of Depression;Current Stress Concerns;Current Psychotropic Meds       Comments No changes since orientation. Ed has attended 1 session so far.  Expected Outcomes For Ed to continue to exercise for stress relief and mental health.  To have a positive outlook with good coping skills to manage his stress and any other psychosocial issues that may arise.       Interventions Encouraged to attend Pulmonary Rehabilitation for the exercise       Continue Psychosocial Services  No Follow up required                Psychosocial Discharge (Final Psychosocial Re-Evaluation):  Psychosocial Re-Evaluation - 03/23/24 1540       Psychosocial Re-Evaluation   Current issues with History of Depression;Current Stress Concerns;Current Psychotropic Meds    Comments No changes since orientation. Ed has attended 1 session so far.    Expected Outcomes For Ed to continue to exercise for stress relief and mental health.  To have a positive outlook with good coping skills to manage his stress  and any other psychosocial issues that may arise.    Interventions Encouraged to attend Pulmonary Rehabilitation for the exercise    Continue Psychosocial Services  No Follow up required             Education: Education Goals: Education classes will be provided on a weekly basis, covering required topics. Participant will state understanding/return demonstration of topics presented.  Learning Barriers/Preferences:  Learning Barriers/Preferences - 03/11/24 1319       Learning Barriers/Preferences   Learning Barriers None    Learning Preferences Group Instruction;Individual Instruction;Written Material             Education Topics: Know Your Numbers Group instruction that is supported by a PowerPoint presentation. Instructor discusses importance of knowing and understanding resting, exercise, and post-exercise oxygen saturation, heart rate, and blood pressure. Oxygen saturation, heart rate, blood pressure, rating of perceived exertion, and dyspnea are reviewed along with a normal range for these values.    Exercise for the Pulmonary Patient Group instruction that is supported by a PowerPoint presentation. Instructor discusses benefits of exercise, core components of exercise, frequency, duration, and intensity of an exercise routine, importance of utilizing pulse oximetry during exercise, safety while exercising, and options of places to exercise outside of rehab.    MET Level  Group instruction provided by PowerPoint, verbal discussion, and written material to support subject matter. Instructor reviews what METs are and how to increase METs.    Pulmonary Medications Verbally interactive group education provided by instructor with focus on inhaled medications and proper administration.   Anatomy and Physiology of the Respiratory System Group instruction provided by PowerPoint, verbal discussion, and written material to support subject matter. Instructor reviews respiratory  cycle and anatomical components of the respiratory system and their functions. Instructor also reviews differences in obstructive and restrictive respiratory diseases with examples of each.    Oxygen Safety Group instruction provided by PowerPoint, verbal discussion, and written material to support subject matter. There is an overview of "What is Oxygen" and "Why do we need it".  Instructor also reviews how to create a safe environment for oxygen use, the importance of using oxygen as prescribed, and the risks of noncompliance. There is a brief discussion on traveling with oxygen and resources the patient may utilize. Flowsheet Row PULMONARY REHAB CHRONIC OBSTRUCTIVE PULMONARY DISEASE from 03/24/2024 in [facility]  Date 03/24/24  Educator EP  Instruction Review Code 1- Verbalizes Understanding       Oxygen Use Group instruction provided by PowerPoint, verbal discussion, and  written material to discuss how supplemental oxygen is prescribed and different types of oxygen supply systems. Resources for more information are provided.    Breathing Techniques Group instruction that is supported by demonstration and informational handouts. Instructor discusses the benefits of pursed lip and diaphragmatic breathing and detailed demonstration on how to perform both.     Risk Factor Reduction Group instruction that is supported by a PowerPoint presentation. Instructor discusses the definition of a risk factor, different risk factors for pulmonary disease, and how the heart and lungs work together.   Pulmonary Diseases Group instruction provided by PowerPoint, verbal discussion, and written material to support subject matter. Instructor gives an overview of the different type of pulmonary diseases. There is also a discussion on risk factors and symptoms as well as ways to manage the diseases.   Stress and Energy Conservation Group  instruction provided by PowerPoint, verbal discussion, and written material to support subject matter. Instructor gives an overview of stress and the impact it can have on the body. Instructor also reviews ways to reduce stress. There is also a discussion on energy conservation and ways to conserve energy throughout the day.   Warning Signs and Symptoms Group instruction provided by PowerPoint, verbal discussion, and written material to support subject matter. Instructor reviews warning signs and symptoms of stroke, heart attack, cold and flu. Instructor also reviews ways to prevent the spread of infection.   Other Education Group or individual verbal, written, or video instructions that support the educational goals of the pulmonary rehab program.    Knowledge Questionnaire Score:  Knowledge Questionnaire Score - 03/11/24 1347       Knowledge Questionnaire Score   Pre Score 16/18             Core Components/Risk Factors/Patient Goals at Admission:  Personal Goals and Risk Factors at Admission - 03/11/24 1320       Core Components/Risk Factors/Patient Goals on Admission    Weight Management Yes;Weight Loss    Intervention Weight Management: Develop a combined nutrition and exercise program designed to reach desired caloric intake, while maintaining appropriate intake of nutrient and fiber, sodium and fats, and appropriate energy expenditure required for the weight goal.;Weight Management: Provide education and appropriate resources to help participant work on and attain dietary goals.;Obesity: Provide education and appropriate resources to help participant work on and attain dietary goals.;Weight Management/Obesity: Establish reasonable short term and long term weight goals.    Admit Weight 415 lb 12.6 oz (188.6 kg)    Expected Outcomes Short Term: Continue to assess and modify interventions until short term weight is achieved;Long Term: Adherence to nutrition and physical  activity/exercise program aimed toward attainment of established weight goal;Weight Loss: Understanding of general recommendations for a balanced deficit meal plan, which promotes 1-2 lb weight loss per week and includes a negative energy balance of [phone] kcal/d;Understanding recommendations for meals to include 15-35% energy as protein, 25-35% energy from fat, 35-60% energy from carbohydrates, less than 200mg  of dietary cholesterol, 20-35 gm of total fiber daily;Understanding of distribution of calorie intake throughout the day with the consumption of 4-5 meals/snacks    Improve shortness of breath with ADL's Yes    Intervention Provide education, individualized exercise plan and daily activity instruction to help decrease symptoms of SOB with activities of daily living.    Expected Outcomes Short Term: Improve cardiorespiratory fitness to achieve a reduction of symptoms when performing ADLs;Long Term: Be able to perform more ADLs without symptoms or delay the onset  of symptoms             Core Components/Risk Factors/Patient Goals Review:   Goals and Risk Factor Review     Row Name 03/23/24 1548             Core Components/Risk Factors/Patient Goals Review   Personal Goals Review Weight Management/Obesity;Develop more efficient breathing techniques such as purse lipped breathing and diaphragmatic breathing and practicing self-pacing with activity.;Improve shortness of breath with ADL's       Review Monthly review of patient's Core Components/Risk Factors/Patient Goals are as follows: Unable to assess goals. Ed has attended 1 class so far.       Expected Outcomes For Ed to lose weight, improve his shortness of breath with ADLs, and develop more efficient breathing techniques such as purse lipped breathing and diaphragmatic breathing; and practicing self-pacing with activity                Core Components/Risk Factors/Patient Goals at Discharge (Final Review):   Goals and Risk  Factor Review - 03/23/24 1548       Core Components/Risk Factors/Patient Goals Review   Personal Goals Review Weight Management/Obesity;Develop more efficient breathing techniques such as purse lipped breathing and diaphragmatic breathing and practicing self-pacing with activity.;Improve shortness of breath with ADL's    Review Monthly review of patient's Core Components/Risk Factors/Patient Goals are as follows: Unable to assess goals. Ed has attended 1 class so far.    Expected Outcomes For Ed to lose weight, improve his shortness of breath with ADLs, and develop more efficient breathing techniques such as purse lipped breathing and diaphragmatic breathing; and practicing self-pacing with activity             ITP Comments:Pt is making expected progress toward Pulmonary Rehab goals after completing 2 session(s). Recommend continued exercise, life style modification, education, and utilization of breathing techniques to increase stamina and strength, while also decreasing shortness of breath with exertion.  Dr. Genetta Kenning is Medical Director for Pulmonary Rehab at [facility].

## 2024-03-31 ENCOUNTER — Encounter (HOSPITAL_COMMUNITY): Admission: RE | Admit: 2024-03-31 | Source: Ambulatory Visit

## 2024-03-31 ENCOUNTER — Telehealth (HOSPITAL_COMMUNITY): Payer: Self-pay

## 2024-03-31 NOTE — Telephone Encounter (Signed)
 Patient c/o for class due to [facility].

## 2024-04-05 ENCOUNTER — Encounter (HOSPITAL_COMMUNITY)
Admission: RE | Admit: 2024-04-05 | Discharge: 2024-04-05 | Disposition: A | Discharge status: Home / Self Care | Source: Ambulatory Visit | Attending: Pulmonary Disease | Admitting: Pulmonary Disease

## 2024-04-05 DIAGNOSIS — J449 Chronic obstructive pulmonary disease, unspecified: Secondary | ICD-10-CM

## 2024-04-07 ENCOUNTER — Encounter (HOSPITAL_COMMUNITY): Admission: RE | Admit: 2024-04-07 | Source: Ambulatory Visit

## 2024-04-12 ENCOUNTER — Telehealth (HOSPITAL_COMMUNITY): Payer: Self-pay

## 2024-04-12 ENCOUNTER — Encounter (HOSPITAL_COMMUNITY)
Admission: RE | Admit: 2024-04-12 | Discharge: 2024-04-12 | Disposition: A | Discharge status: Home / Self Care | Source: Ambulatory Visit | Attending: Pulmonary Disease | Admitting: Pulmonary Disease

## 2024-04-12 NOTE — Telephone Encounter (Signed)
 Patient unable to make it due to work schedule today, will be in Thursday.

## 2024-04-14 ENCOUNTER — Encounter (HOSPITAL_COMMUNITY)

## 2024-04-14 ENCOUNTER — Telehealth (HOSPITAL_COMMUNITY): Payer: Self-pay

## 2024-04-14 NOTE — Telephone Encounter (Signed)
 Patient c/o for 1:15pm class, states he was called in to work. Will be in Tuesday.

## 2024-04-18 ENCOUNTER — Ambulatory Visit: Admitting: Internal Medicine

## 2024-04-19 ENCOUNTER — Encounter (HOSPITAL_COMMUNITY): Admission: RE | Admit: 2024-04-19 | Source: Ambulatory Visit

## 2024-04-21 ENCOUNTER — Encounter (HOSPITAL_COMMUNITY)
Admission: RE | Admit: 2024-04-21 | Discharge: 2024-04-21 | Disposition: A | Discharge status: Home / Self Care | Source: Ambulatory Visit | Attending: Pulmonary Disease | Admitting: Pulmonary Disease

## 2024-04-21 DIAGNOSIS — J449 Chronic obstructive pulmonary disease, unspecified: Secondary | ICD-10-CM | POA: Insufficient documentation

## 2024-04-22 ENCOUNTER — Telehealth (HOSPITAL_COMMUNITY): Payer: Self-pay | Admitting: *Deleted

## 2024-04-22 NOTE — Telephone Encounter (Signed)
 Called Peter Lewis to discuss his commitment to PR as he has missed the last 5 sessions. He sts he had a work situation that he could not get away but now that is resolved and he will be back next week.  German Koller BS, ACSM-CEP 04/22/2024 2:02 PM

## 2024-04-22 NOTE — Progress Notes (Signed)
 Daily Session Note  Patient Details  Name: Peter Lewis MRN: [id number] Date of Birth: 01/21/1956 Referring Provider:   Gattis Kass Pulmonary Rehab Walk Test from 03/11/2024 in [facility]  Referring Provider Briones  [[place]]       Encounter Date: 04/05/2024  Check In:   Capillary Blood Glucose: No results found for this or any previous visit (from the past 24 hours).    Social History   Tobacco Use  Smoking Status Never  Smokeless Tobacco Never    Goals Met:  Independence with exercise equipment Exercise tolerated well No report of concerns or symptoms today Strength training completed today  Goals Unmet:  Not Applicable  Comments: Service time is from 1318 to 1450.    Dr. Genetta Kenning is Medical Director for Pulmonary Rehab at [facility].

## 2024-04-25 ENCOUNTER — Telehealth (HOSPITAL_COMMUNITY): Payer: Self-pay

## 2024-04-25 NOTE — Telephone Encounter (Signed)
 [facility] called stating [facility] and will not be in for Pulmonary Rehab. Anticipates being able to make the rest of his classes for the remainder of the program.

## 2024-04-26 ENCOUNTER — Encounter (HOSPITAL_COMMUNITY)

## 2024-04-27 NOTE — Progress Notes (Signed)
 Pulmonary Individual Treatment Plan  Patient Details  Name: Peter Lewis MRN: [id number] Date of Birth: 1956-02-18 Referring Provider:   Gattis Kass Pulmonary Rehab Walk Test from 03/11/2024 in [facility]  Referring Provider Briones  [Ellison]       Initial Encounter Date:  Flowsheet Row Pulmonary Rehab Walk Test from 03/11/2024 in [facility]  Date 03/11/24       Visit Diagnosis: Chronic obstructive pulmonary disease, unspecified COPD type (HCC)  Patient's Home Medications on Admission:   Current Outpatient Medications:    acetaminophen  (TYLENOL ) 650 MG CR tablet, Take 1,300 mg by mouth every 8 (eight) hours as needed for pain., Disp: , Rfl:    albuterol  (PROAIR  HFA) 108 (90 Base) MCG/ACT inhaler, 2 puffs every 4 hours as needed only  if your can't catch your breath, Disp: 18 g, Rfl: 11   aspirin EC 81 MG tablet, Take 81 mg by mouth daily., Disp: , Rfl:    atorvastatin (LIPITOR) 20 MG tablet, Take by mouth., Disp: , Rfl:    budesonide -formoterol  (SYMBICORT ) 80-4.5 MCG/ACT inhaler, Take 2 puffs first thing in am and then another 2 puffs about 12 hours later., Disp: 1 each, Rfl: 12   Carboxymethylcellulose Sod PF 0.5 % SOLN, Apply to eye., Disp: , Rfl:    cetirizine (ZYRTEC) 10 MG tablet, Take 10 mg by mouth daily., Disp: , Rfl:    empagliflozin  (JARDIANCE ) 25 MG TABS tablet, Take 1 tablet (25 mg total) by mouth daily., Disp: , Rfl:    fluticasone (FLONASE) 50 MCG/ACT nasal spray, Place into the nose., Disp: , Rfl:    ibuprofen (ADVIL,MOTRIN) 200 MG tablet, Take 600 mg by mouth every 6 (six) hours as needed for moderate pain., Disp: , Rfl:    ketotifen (ZADITOR) 0.035 % ophthalmic solution, Apply to eye., Disp: , Rfl:    losartan (COZAAR) 100 MG tablet, Take 0.5 tablets by mouth daily., Disp: , Rfl:    metFORMIN (GLUCOPHAGE) 500 MG tablet, Take 500 mg by mouth 2  (two) times daily with a meal., Disp: , Rfl:    montelukast (SINGULAIR) 10 MG tablet, Take 10 mg by mouth., Disp: , Rfl:    omeprazole (PRILOSEC) 20 MG capsule, Take by mouth., Disp: , Rfl:    sertraline (ZOLOFT) 100 MG tablet, Take by mouth., Disp: , Rfl:    tadalafil (CIALIS) 20 MG tablet, Take by mouth., Disp: , Rfl:    traZODone (DESYREL) 50 MG tablet, Take by mouth., Disp: , Rfl:    TRELEGY ELLIPTA 100-62.5-25 MCG/ACT AEPB, Inhale 1 puff into the lungs daily., Disp: , Rfl:   Past Medical History: Past Medical History:  Diagnosis Date   Arthritis    Asthma    Diabetes mellitus without complication (HCC)    GERD (gastroesophageal reflux disease)    Hyperlipidemia    Hypertension    Sleep apnea     Tobacco Use: Social History   Tobacco Use  Smoking Status Never  Smokeless Tobacco Never    Labs: Review Flowsheet       Latest Ref Rng & Units 06/22/2015 05/07/2018  Labs for ITP Cardiac and Pulmonary Rehab  Hemoglobin A1c 4.8 - 5.6 % - 5.4   TCO2 0 - 100 mmol/L 26  -    Capillary Blood Glucose: Lab Results  Component Value Date   GLUCAP 108 (H) 03/24/2024   GLUCAP 113 (H) 03/24/2024  GLUCAP 102 (H) 03/22/2024   GLUCAP 154 (H) 03/22/2024   GLUCAP 116 (H) 05/14/2018     Pulmonary Assessment Scores:  Pulmonary Assessment Scores     Row Name 03/11/24 1349         ADL UCSD   ADL Phase Entry     SOB Score total 61       CAT Score   CAT Score 21       mMRC Score   mMRC Score 2             UCSD: Self-administered rating of dyspnea associated with activities of daily living (ADLs) 6-point scale (0 = "not at all" to 5 = "maximal or unable to do because of breathlessness")  Scoring Scores range from 0 to 120.  Minimally important difference is 5 units  CAT: CAT can identify the health impairment of COPD patients and is better correlated with disease progression.  CAT has a scoring range of zero to 40. The CAT score is classified into four groups of low  (less than 10), medium (10 - 20), high (21-30) and very high (31-40) based on the impact level of disease on health status. A CAT score over 10 suggests significant symptoms.  A worsening CAT score could be explained by an exacerbation, poor medication adherence, poor inhaler technique, or progression of COPD or comorbid conditions.  CAT MCID is 2 points  mMRC: mMRC (Modified Medical Research Council) Dyspnea Scale is used to assess the degree of baseline functional disability in patients of respiratory disease due to dyspnea. No minimal important difference is established. A decrease in score of 1 point or greater is considered a positive change.   Pulmonary Function Assessment:  Pulmonary Function Assessment - 03/11/24 1349       Breath   Bilateral Breath Sounds Clear    Shortness of Breath Yes;Limiting activity             Exercise Target Goals: Exercise Program Goal: Individual exercise prescription set using results from initial 6 min walk test and THRR while considering  patient's activity barriers and safety.   Exercise Prescription Goal: Initial exercise prescription builds to 30-45 minutes a day of aerobic activity, 2-3 days per week.  Home exercise guidelines will be given to patient during program as part of exercise prescription that the participant will acknowledge.  Activity Barriers & Risk Stratification:  Activity Barriers & Cardiac Risk Stratification - 03/11/24 1321       Activity Barriers & Cardiac Risk Stratification   Activity Barriers Neck/Spine Problems;Arthritis;Back Problems;Joint Problems;Deconditioning;Muscular Weakness;Shortness of Breath;History of Falls    Cardiac Risk Stratification Moderate             6 Minute Walk:  6 Minute Walk     Row Name 03/11/24 1413         6 Minute Walk   Phase Initial     Distance 1024 feet     Walk Time 6 minutes     # of Rest Breaks 0     MPH 1.94     METS 1.56     RPE 11     Perceived Dyspnea  1      VO2 Peak 5.45     Symptoms No     Resting HR 87 bpm     Resting BP 148/76     Resting Oxygen Saturation  97 %     Exercise Oxygen Saturation  during 6 min walk 91 %  Max Ex. HR 87 bpm     Max Ex. BP 148/76     2 Minute Post BP 150/74       Interval HR   1 Minute HR 82     2 Minute HR 78     3 Minute HR 79     4 Minute HR 79     5 Minute HR 79     6 Minute HR 87     2 Minute Post HR 67     Interval Heart Rate? Yes       Interval Oxygen   Interval Oxygen? Yes     Baseline Oxygen Saturation % 92 %     1 Minute Oxygen Saturation % 93 %     1 Minute Liters of Oxygen 0 L     2 Minute Oxygen Saturation % 93 %     2 Minute Liters of Oxygen 0 L     3 Minute Oxygen Saturation % 92 %     3 Minute Liters of Oxygen 0 L     4 Minute Oxygen Saturation % 91 %     4 Minute Liters of Oxygen 0 L     5 Minute Oxygen Saturation % 91 %     5 Minute Liters of Oxygen 0 L     6 Minute Oxygen Saturation % 91 %     6 Minute Liters of Oxygen 0 L     2 Minute Post Oxygen Saturation % 97 %     2 Minute Post Liters of Oxygen 0 L              Oxygen Initial Assessment:  Oxygen Initial Assessment - 03/11/24 1324       Home Oxygen   Home Oxygen Device None    Sleep Oxygen Prescription CPAP    Home Exercise Oxygen Prescription None    Home Resting Oxygen Prescription None      Initial 6 min Walk   Oxygen Used None      Program Oxygen Prescription   Program Oxygen Prescription None      Intervention   Short Term Goals To learn and understand importance of monitoring SPO2 with pulse oximeter and demonstrate accurate use of the pulse oximeter.;To learn and understand importance of maintaining oxygen saturations>88%;To learn and demonstrate proper pursed lip breathing techniques or other breathing techniques. ;To learn and demonstrate proper use of respiratory medications    Long  Term Goals Maintenance of O2 saturations>88%;Compliance with respiratory medication;Verbalizes importance of  monitoring SPO2 with pulse oximeter and return demonstration;Exhibits proper breathing techniques, such as pursed lip breathing or other method taught during program session;Demonstrates proper use of MDI's             Oxygen Re-Evaluation:  Oxygen Re-Evaluation     Row Name 03/25/24 0938 04/22/24 1358           Program Oxygen Prescription   Program Oxygen Prescription None None        Home Oxygen   Home Oxygen Device None None      Sleep Oxygen Prescription CPAP CPAP      Home Exercise Oxygen Prescription None None      Home Resting Oxygen Prescription None None        Goals/Expected Outcomes   Short Term Goals To learn and understand importance of monitoring SPO2 with pulse oximeter and demonstrate accurate use of the pulse oximeter.;To learn and understand importance of maintaining oxygen saturations>88%;To learn  and demonstrate proper pursed lip breathing techniques or other breathing techniques. ;To learn and demonstrate proper use of respiratory medications To learn and understand importance of monitoring SPO2 with pulse oximeter and demonstrate accurate use of the pulse oximeter.;To learn and understand importance of maintaining oxygen saturations>88%;To learn and demonstrate proper pursed lip breathing techniques or other breathing techniques. ;To learn and demonstrate proper use of respiratory medications      Long  Term Goals Maintenance of O2 saturations>88%;Compliance with respiratory medication;Verbalizes importance of monitoring SPO2 with pulse oximeter and return demonstration;Exhibits proper breathing techniques, such as pursed lip breathing or other method taught during program session;Demonstrates proper use of MDI's Maintenance of O2 saturations>88%;Compliance with respiratory medication;Verbalizes importance of monitoring SPO2 with pulse oximeter and return demonstration;Exhibits proper breathing techniques, such as pursed lip breathing or other method taught during  program session;Demonstrates proper use of MDI's      Goals/Expected Outcomes Compliance and understanding of oxygen saturation monitoring and breath techniques to decrease shortness of breath. Compliance and understanding of oxygen saturation monitoring and breath techniques to decrease shortness of breath.               Oxygen Discharge (Final Oxygen Re-Evaluation):  Oxygen Re-Evaluation - 04/22/24 1358       Program Oxygen Prescription   Program Oxygen Prescription None      Home Oxygen   Home Oxygen Device None    Sleep Oxygen Prescription CPAP    Home Exercise Oxygen Prescription None    Home Resting Oxygen Prescription None      Goals/Expected Outcomes   Short Term Goals To learn and understand importance of monitoring SPO2 with pulse oximeter and demonstrate accurate use of the pulse oximeter.;To learn and understand importance of maintaining oxygen saturations>88%;To learn and demonstrate proper pursed lip breathing techniques or other breathing techniques. ;To learn and demonstrate proper use of respiratory medications    Long  Term Goals Maintenance of O2 saturations>88%;Compliance with respiratory medication;Verbalizes importance of monitoring SPO2 with pulse oximeter and return demonstration;Exhibits proper breathing techniques, such as pursed lip breathing or other method taught during program session;Demonstrates proper use of MDI's    Goals/Expected Outcomes Compliance and understanding of oxygen saturation monitoring and breath techniques to decrease shortness of breath.             Initial Exercise Prescription:  Initial Exercise Prescription - 03/11/24 1400       Date of Initial Exercise RX and Referring Provider   Date 03/11/24    Referring Provider Harless Lien   Expected Discharge Date 06/08/24      Treadmill   MPH 2    Grade 0    Minutes 15    METs 2.54      Elliptical   Level 1    Speed 1    Minutes 15    METs 2.5      Prescription  Details   Frequency (times per week) 2    Duration Progress to 30 minutes of continuous aerobic without signs/symptoms of physical distress      Intensity   THRR 40-80% of Max Heartrate 60-121    Ratings of Perceived Exertion 11-13    Perceived Dyspnea 0-4      Progression   Progression Continue to progress workloads to maintain intensity without signs/symptoms of physical distress.      Resistance Training   Training Prescription Yes    Weight blue bands    Reps 10-15  Perform Capillary Blood Glucose checks as needed.  Exercise Prescription Changes:   Exercise Prescription Changes     Row Name 03/24/24 1505 04/05/24 1506           Response to Exercise   Blood Pressure (Admit) 130/62 142/68      Blood Pressure (Exit) 140/64 132/66      Heart Rate (Admit) 73 bpm 84 bpm      Heart Rate (Exercise) 120 bpm 118 bpm      Heart Rate (Exit) 88 bpm 78 bpm      Oxygen Saturation (Admit) 95 % 94 %      Oxygen Saturation (Exercise) 90 % 92 %      Oxygen Saturation (Exit) 95 % 96 %      Rating of Perceived Exertion (Exercise) 12 1      Perceived Dyspnea (Exercise) 1 1      Duration Continue with 30 min of aerobic exercise without signs/symptoms of physical distress. Continue with 30 min of aerobic exercise without signs/symptoms of physical distress.      Intensity THRR unchanged THRR unchanged        Resistance Training   Training Prescription Yes Yes      Weight black bands black bands      Reps 10-15 10-15      Time 10 Minutes 10 Minutes        Treadmill   MPH 2.1 2.5      Grade 0 0      Minutes 15 15      METs 2.5 2.7        Elliptical   Level 1 1      Speed 1 1      Minutes 15 15      METs 4 4.2               Exercise Comments:   Exercise Comments     Row Name 03/22/24 1507           Exercise Comments Pt completed his first day of group exercise. He walked on the treadmill for 15 min, 2 mph, 0 incline, METs 2.4. He then exercised on  the upright elliptical for 15 min level 1, incline 1, METs 3.9. Tolerated well although fatigued. Performed warm up and cool down without limitations. Discussed METs.                Exercise Goals and Review:   Exercise Goals     Row Name 03/11/24 1323             Exercise Goals   Increase Physical Activity Yes       Intervention Provide advice, education, support and counseling about physical activity/exercise needs.;Develop an individualized exercise prescription for aerobic and resistive training based on initial evaluation findings, risk stratification, comorbidities and participant's personal goals.       Expected Outcomes Short Term: Attend rehab on a regular basis to increase amount of physical activity.;Long Term: Add in home exercise to make exercise part of routine and to increase amount of physical activity.;Long Term: Exercising regularly at least 3-5 days a week.       Increase Strength and Stamina Yes       Intervention Provide advice, education, support and counseling about physical activity/exercise needs.;Develop an individualized exercise prescription for aerobic and resistive training based on initial evaluation findings, risk stratification, comorbidities and participant's personal goals.       Expected Outcomes Short Term: Increase workloads from  initial exercise prescription for resistance, speed, and METs.;Short Term: Perform resistance training exercises routinely during rehab and add in resistance training at home;Long Term: Improve cardiorespiratory fitness, muscular endurance and strength as measured by increased METs and functional capacity ( )       Able to understand and use rate of perceived exertion (RPE) scale Yes       Intervention Provide education and explanation on how to use RPE scale       Expected Outcomes Short Term: Able to use RPE daily in rehab to express subjective intensity level;Long Term:  Able to use RPE to guide intensity level when  exercising independently       Able to understand and use Dyspnea scale Yes       Intervention Provide education and explanation on how to use Dyspnea scale       Expected Outcomes Short Term: Able to use Dyspnea scale daily in rehab to express subjective sense of shortness of breath during exertion;Long Term: Able to use Dyspnea scale to guide intensity level when exercising independently       Knowledge and understanding of Target Heart Rate Range (THRR) Yes       Intervention Provide education and explanation of THRR including how the numbers were predicted and where they are located for reference       Expected Outcomes Short Term: Able to state/look up THRR;Long Term: Able to use THRR to govern intensity when exercising independently;Short Term: Able to use daily as guideline for intensity in rehab       Understanding of Exercise Prescription Yes       Intervention Provide education, explanation, and written materials on patient's individual exercise prescription       Expected Outcomes Long Term: Able to explain home exercise prescription to exercise independently;Short Term: Able to explain program exercise prescription                Exercise Goals Re-Evaluation :  Exercise Goals Re-Evaluation     Row Name 03/25/24 0935 04/22/24 1359           Exercise Goal Re-Evaluation   Exercise Goals Review Increase Physical Activity;Able to understand and use Dyspnea scale;Understanding of Exercise Prescription;Increase Strength and Stamina;Knowledge and understanding of Target Heart Rate Range (THRR);Able to understand and use rate of perceived exertion (RPE) scale Increase Physical Activity;Able to understand and use Dyspnea scale;Understanding of Exercise Prescription;Increase Strength and Stamina;Knowledge and understanding of Target Heart Rate Range (THRR);Able to understand and use rate of perceived exertion (RPE) scale      Comments Pt has completed 2 exercise sessions. He is walking on  the treadmill for 15 min, 2.1 mph, 0 incline, METs 2.5. He then is exercising on the upright elliptical for 15 min, level 1, incline 1, METs 4.0. The elliptical is challenging for him. Performs warm up and cool down without limitations, using blue bands (5.8 lbs). Will progress as tolerated. Pt has completed 3 exercise sessions. He has had numerous missed appointments due to work conflicts. He sts now he will be able to commit to the program going forward. He is walking on the treadmill for 15 min, 2.5 mph, 0 incline, METs 2.91. He then is exercising on the upright elliptical for 15 min, level 1, incline 1, METs 4.2. He began progressing right before he missed 5 appts.  Performs warm up and cool down without limitations, using blue bands (5.8 lbs). Will progress as tolerated.      Expected Outcomes Through exercise  in rehab and at home, the patient will decrease shortness of breath with daily activities and feel confident in doing an exercise regimen at home. Through exercise in rehab and at home, the patient will decrease shortness of breath with daily activities and feel confident in doing an exercise regimen at home.               Discharge Exercise Prescription (Final Exercise Prescription Changes):  Exercise Prescription Changes - 04/05/24 1506       Response to Exercise   Blood Pressure (Admit) 142/68    Blood Pressure (Exit) 132/66    Heart Rate (Admit) 84 bpm    Heart Rate (Exercise) 118 bpm    Heart Rate (Exit) 78 bpm    Oxygen Saturation (Admit) 94 %    Oxygen Saturation (Exercise) 92 %    Oxygen Saturation (Exit) 96 %    Rating of Perceived Exertion (Exercise) 1    Perceived Dyspnea (Exercise) 1    Duration Continue with 30 min of aerobic exercise without signs/symptoms of physical distress.    Intensity THRR unchanged      Resistance Training   Training Prescription Yes    Weight black bands    Reps 10-15    Time 10 Minutes      Treadmill   MPH 2.5    Grade 0     Minutes 15    METs 2.7      Elliptical   Level 1    Speed 1    Minutes 15    METs 4.2             Nutrition:  Target Goals: Understanding of nutrition guidelines, daily intake of sodium 1500mg , cholesterol 200mg , calories 30% from fat and 7% or less from saturated fats, daily to have 5 or more servings of fruits and vegetables.  Biometrics:  Pre Biometrics - 03/11/24 1247       Pre Biometrics   Grip Strength 20 kg              Nutrition Therapy Plan and Nutrition Goals:  Nutrition Therapy & Goals - 04/21/24 1342       Nutrition Therapy   Diet Heart Healthy/carbohydrate consistent diet    Drug/Food Interactions Statins/Certain Fruits      Personal Nutrition Goals   Nutrition Goal Patient to improve diet quality by using the plate method as a guide for meal planning to include lean protein/plant protein, fruits, vegetables, whole grains, nonfat dairy as part of a well-balanced diet   goal not met.   Personal Goal #2 Patient to identfiy strategies for weight loss of 0.5-2.0# per week.   goal in progress.   Comments Goals not met. Ed has only attended three rehab sessions. Patient has medical history of COPD3, HTN, OSA, DM2, hyperlipidemia. He is motivated to lose weight; will discuss multiple strategies for weight loss including calorie density, the plate method for meal planning, etc. He is down 5.1# since starting with our program. Patient will benefit from participation in pulmonary rehab for nutrition, exericse, lifestyle modification support.      Intervention Plan   Intervention Prescribe, educate and counsel regarding individualized specific dietary modifications aiming towards targeted core components such as weight, hypertension, lipid management, diabetes, heart failure and other comorbidities.;Nutrition handout(s) given to patient.    Expected Outcomes Short Term Goal: Understand basic principles of dietary content, such as calories, fat, sodium, cholesterol  and nutrients.;Long Term Goal: Adherence to prescribed nutrition plan.  Nutrition Assessments:  MEDIFICTS Score Key: >=70 Need to make dietary changes  40-70 Heart Healthy Diet <= 40 Therapeutic Level Cholesterol Diet   Picture Your Plate Scores: <96 Unhealthy dietary pattern with much room for improvement. 41-50 Dietary pattern unlikely to meet recommendations for good health and room for improvement. 51-60 More healthful dietary pattern, with some room for improvement.  >60 Healthy dietary pattern, although there may be some specific behaviors that could be improved.    Nutrition Goals Re-Evaluation:  Nutrition Goals Re-Evaluation     Row Name 03/22/24 1432 04/21/24 1342           Goals   Current Weight 257 lb 0.9 oz (116.6 kg) 256 lb 6.3 oz (116.3 kg)  weight from last attended session on 04/05/24      Comment A1c 7.4, LDL 101 no new labs; most recent labs  A1c 7.4, LDL 101      Expected Outcome Patient has medical history of COPD3, HTN, OSA, DM2, hyperlipidemia. He is motivated to lose weight; will discuss multiple strategies for weight loss including calorie density, the plate method for meal planning, etc. Patient will benefit from participation in pulmonary rehab for nutrition, exericse, lifestyle modification support. Goals not met. Ed has only attended three rehab sessions. Patient has medical history of COPD3, HTN, OSA, DM2, hyperlipidemia. He is motivated to lose weight; will discuss multiple strategies for weight loss including calorie density, the plate method for meal planning, etc. He is down 5.1# since starting with our program. Patient will benefit from participation in pulmonary rehab for nutrition, exericse, lifestyle modification support.               Nutrition Goals Discharge (Final Nutrition Goals Re-Evaluation):  Nutrition Goals Re-Evaluation - 04/21/24 1342       Goals   Current Weight 256 lb 6.3 oz (116.3 kg)   weight from last  attended session on 04/05/24   Comment no new labs; most recent labs  A1c 7.4, LDL 101    Expected Outcome Goals not met. Ed has only attended three rehab sessions. Patient has medical history of COPD3, HTN, OSA, DM2, hyperlipidemia. He is motivated to lose weight; will discuss multiple strategies for weight loss including calorie density, the plate method for meal planning, etc. He is down 5.1# since starting with our program. Patient will benefit from participation in pulmonary rehab for nutrition, exericse, lifestyle modification support.             Psychosocial: Target Goals: Acknowledge presence or absence of significant depression and/or stress, maximize coping skills, provide positive support system. Participant is able to verbalize types and ability to use techniques and skills needed for reducing stress and depression.  Initial Review & Psychosocial Screening:  Initial Psych Review & Screening - 03/11/24 1318       Initial Review   Current issues with History of Depression;Current Anxiety/Panic;Current Psychotropic Meds;Current Stress Concerns    Source of Stress Concerns Chronic Illness    Comments Pt states he feels stressed about his health issues. He also states he is in a new marriage and somedays it is "challenging".      Family Dynamics   Good Support System? Yes      Barriers   Psychosocial barriers to participate in program The patient should benefit from training in stress management and relaxation.      Screening Interventions   Interventions Encouraged to exercise;To provide support and resources with identified psychosocial needs    Expected Outcomes  Short Term goal: Utilizing psychosocial counselor, staff and physician to assist with identification of specific Stressors or current issues interfering with healing process. Setting desired goal for each stressor or current issue identified.;Long Term Goal: Stressors or current issues are controlled or eliminated.              Quality of Life Scores:  Scores of 19 and below usually indicate a poorer quality of life in these areas.  A difference of  2-3 points is a clinically meaningful difference.  A difference of 2-3 points in the total score of the Quality of Life Index has been associated with significant improvement in overall quality of life, self-image, physical symptoms, and general health in studies assessing change in quality of life.  PHQ-9: Review Flowsheet       03/11/2024  Depression screen PHQ 2/9  Decreased Interest 0  Down, Depressed, Hopeless 0  PHQ - 2 Score 0  Altered sleeping 0  Tired, decreased energy 0  Change in appetite 0  Feeling bad or failure about yourself  0  Trouble concentrating 0  Moving slowly or fidgety/restless 0  Suicidal thoughts 0  PHQ-9 Score 0  Difficult doing work/chores Not difficult at all   Interpretation of Total Score  Total Score Depression Severity:  1-4 = Minimal depression, 5-9 = Mild depression, 10-14 = Moderate depression, 15-19 = Moderately severe depression, 20-27 = Severe depression   Psychosocial Evaluation and Intervention:  Psychosocial Evaluation - 03/11/24 1346       Psychosocial Evaluation & Interventions   Interventions Stress management education;Relaxation education;Encouraged to exercise with the program and follow exercise prescription    Comments Pt states he feels stressed about his health issues. He also states he is in a new marriage and somedays it is "challenging".    Expected Outcomes For Ed to have less stress and anxiety while participating in pulmonary rehab.    Continue Psychosocial Services  No Follow up required             Psychosocial Re-Evaluation:  Psychosocial Re-Evaluation     Row Name 03/23/24 1540 04/22/24 1029           Psychosocial Re-Evaluation   Current issues with History of Depression;Current Stress Concerns;Current Psychotropic Meds History of Depression;Current Stress  Concerns;Current Psychotropic Meds      Comments No changes since orientation. Ed has attended 1 session so far. Psychosocial monthly re-evaluation is as follows: Ed has had poor attendance in [facility] due to his work schedule. He has attended 3 sessions in 6 weeks. His job has become more demanding on attendance. He would like to fully commit to rehab but states he also must make money. Ed denies any new psy/soc concerns. He feels like his mental health is stable at the time. He declines any additional resources or referrals currently. We will continue to follow up and assess needs monthly.      Expected Outcomes For Ed to continue to exercise for stress relief and mental health.  To have a positive outlook with good coping skills to manage his stress and any other psychosocial issues that may arise. For Ed to continue to exercise for stress relief and mental health.  To have a positive outlook with good coping skills to manage his stress and any other psychosocial issues that may arise.      Interventions Encouraged to attend Pulmonary Rehabilitation for the exercise Encouraged to attend Pulmonary Rehabilitation for the exercise      Continue  Psychosocial Services  No Follow up required No Follow up required               Psychosocial Discharge (Final Psychosocial Re-Evaluation):  Psychosocial Re-Evaluation - 04/22/24 1029       Psychosocial Re-Evaluation   Current issues with History of Depression;Current Stress Concerns;Current Psychotropic Meds    Comments Psychosocial monthly re-evaluation is as follows: Ed has had poor attendance in [facility] due to his work schedule. He has attended 3 sessions in 6 weeks. His job has become more demanding on attendance. He would like to fully commit to rehab but states he also must make money. Ed denies any new psy/soc concerns. He feels like his mental health is stable at the time. He declines any additional resources or referrals currently. We will  continue to follow up and assess needs monthly.    Expected Outcomes For Ed to continue to exercise for stress relief and mental health.  To have a positive outlook with good coping skills to manage his stress and any other psychosocial issues that may arise.    Interventions Encouraged to attend Pulmonary Rehabilitation for the exercise    Continue Psychosocial Services  No Follow up required             Education: Education Goals: Education classes will be provided on a weekly basis, covering required topics. Participant will state understanding/return demonstration of topics presented.  Learning Barriers/Preferences:  Learning Barriers/Preferences - 03/11/24 1319       Learning Barriers/Preferences   Learning Barriers None    Learning Preferences Group Instruction;Individual Instruction;Written Material             Education Topics: Know Your Numbers Group instruction that is supported by a PowerPoint presentation. Instructor discusses importance of knowing and understanding resting, exercise, and post-exercise oxygen saturation, heart rate, and blood pressure. Oxygen saturation, heart rate, blood pressure, rating of perceived exertion, and dyspnea are reviewed along with a normal range for these values.    Exercise for the Pulmonary Patient Group instruction that is supported by a PowerPoint presentation. Instructor discusses benefits of exercise, core components of exercise, frequency, duration, and intensity of an exercise routine, importance of utilizing pulse oximetry during exercise, safety while exercising, and options of places to exercise outside of rehab.    MET Level  Group instruction provided by PowerPoint, verbal discussion, and written material to support subject matter. Instructor reviews what METs are and how to increase METs.    Pulmonary Medications Verbally interactive group education provided by instructor with focus on inhaled medications and proper  administration.   Anatomy and Physiology of the Respiratory System Group instruction provided by PowerPoint, verbal discussion, and written material to support subject matter. Instructor reviews respiratory cycle and anatomical components of the respiratory system and their functions. Instructor also reviews differences in obstructive and restrictive respiratory diseases with examples of each.    Oxygen Safety Group instruction provided by PowerPoint, verbal discussion, and written material to support subject matter. There is an overview of "What is Oxygen" and "Why do we need it".  Instructor also reviews how to create a safe environment for oxygen use, the importance of using oxygen as prescribed, and the risks of noncompliance. There is a brief discussion on traveling with oxygen and resources the patient may utilize. Flowsheet Row PULMONARY REHAB CHRONIC OBSTRUCTIVE PULMONARY DISEASE from 03/24/2024 in [facility]  Date 03/24/24  Educator EP  Instruction Review Code  1- Verbalizes Understanding       Oxygen Use Group instruction provided by PowerPoint, verbal discussion, and written material to discuss how supplemental oxygen is prescribed and different types of oxygen supply systems. Resources for more information are provided.    Breathing Techniques Group instruction that is supported by demonstration and informational handouts. Instructor discusses the benefits of pursed lip and diaphragmatic breathing and detailed demonstration on how to perform both.     Risk Factor Reduction Group instruction that is supported by a PowerPoint presentation. Instructor discusses the definition of a risk factor, different risk factors for pulmonary disease, and how the heart and lungs work together.   Pulmonary Diseases Group instruction provided by PowerPoint, verbal discussion, and written material to support subject matter. Instructor  gives an overview of the different type of pulmonary diseases. There is also a discussion on risk factors and symptoms as well as ways to manage the diseases.   Stress and Energy Conservation Group instruction provided by PowerPoint, verbal discussion, and written material to support subject matter. Instructor gives an overview of stress and the impact it can have on the body. Instructor also reviews ways to reduce stress. There is also a discussion on energy conservation and ways to conserve energy throughout the day.   Warning Signs and Symptoms Group instruction provided by PowerPoint, verbal discussion, and written material to support subject matter. Instructor reviews warning signs and symptoms of stroke, heart attack, cold and flu. Instructor also reviews ways to prevent the spread of infection.   Other Education Group or individual verbal, written, or video instructions that support the educational goals of the pulmonary rehab program.    Knowledge Questionnaire Score:  Knowledge Questionnaire Score - 03/11/24 1347       Knowledge Questionnaire Score   Pre Score 16/18             Core Components/Risk Factors/Patient Goals at Admission:  Personal Goals and Risk Factors at Admission - 03/11/24 1320       Core Components/Risk Factors/Patient Goals on Admission    Weight Management Yes;Weight Loss    Intervention Weight Management: Develop a combined nutrition and exercise program designed to reach desired caloric intake, while maintaining appropriate intake of nutrient and fiber, sodium and fats, and appropriate energy expenditure required for the weight goal.;Weight Management: Provide education and appropriate resources to help participant work on and attain dietary goals.;Obesity: Provide education and appropriate resources to help participant work on and attain dietary goals.;Weight Management/Obesity: Establish reasonable short term and long term weight goals.    Admit  Weight 415 lb 12.6 oz (188.6 kg)    Expected Outcomes Short Term: Continue to assess and modify interventions until short term weight is achieved;Long Term: Adherence to nutrition and physical activity/exercise program aimed toward attainment of established weight goal;Weight Loss: Understanding of general recommendations for a balanced deficit meal plan, which promotes 1-2 lb weight loss per week and includes a negative energy balance of [phone] kcal/d;Understanding recommendations for meals to include 15-35% energy as protein, 25-35% energy from fat, 35-60% energy from carbohydrates, less than 200mg  of dietary cholesterol, 20-35 gm of total fiber daily;Understanding of distribution of calorie intake throughout the day with the consumption of 4-5 meals/snacks    Improve shortness of breath with ADL's Yes    Intervention Provide education, individualized exercise plan and daily activity instruction to help decrease symptoms of SOB with activities of daily living.    Expected Outcomes Short Term: Improve cardiorespiratory fitness to achieve a  reduction of symptoms when performing ADLs;Long Term: Be able to perform more ADLs without symptoms or delay the onset of symptoms             Core Components/Risk Factors/Patient Goals Review:   Goals and Risk Factor Review     Row Name 03/23/24 1548 04/22/24 1031           Core Components/Risk Factors/Patient Goals Review   Personal Goals Review Weight Management/Obesity;Develop more efficient breathing techniques such as purse lipped breathing and diaphragmatic breathing and practicing self-pacing with activity.;Improve shortness of breath with ADL's Weight Management/Obesity;Develop more efficient breathing techniques such as purse lipped breathing and diaphragmatic breathing and practicing self-pacing with activity.;Improve shortness of breath with ADL's      Review Monthly review of patient's Core Components/Risk Factors/Patient Goals are as follows:  Unable to assess goals. Ed has attended 1 class so far. Monthly review of patient's Core Components/Risk Factors/Patient Goals are as follows: Unable to assess goals. Ed has attended 3 classes in 6 weeks due to work conflicts.      Expected Outcomes For Ed to lose weight, improve his shortness of breath with ADLs, and develop more efficient breathing techniques such as purse lipped breathing and diaphragmatic breathing; and practicing self-pacing with activity Pt will show progress toward meeting expected goals and outcomes.               Core Components/Risk Factors/Patient Goals at Discharge (Final Review):   Goals and Risk Factor Review - 04/22/24 1031       Core Components/Risk Factors/Patient Goals Review   Personal Goals Review Weight Management/Obesity;Develop more efficient breathing techniques such as purse lipped breathing and diaphragmatic breathing and practicing self-pacing with activity.;Improve shortness of breath with ADL's    Review Monthly review of patient's Core Components/Risk Factors/Patient Goals are as follows: Unable to assess goals. Ed has attended 3 classes in 6 weeks due to work conflicts.    Expected Outcomes Pt will show progress toward meeting expected goals and outcomes.             ITP Comments: Pt is making expected progress toward Pulmonary Rehab goals after completing 3 session(s). Recommend continued exercise, life style modification, education, and utilization of breathing techniques to increase stamina and strength, while also decreasing shortness of breath with exertion.     Comments: Dr. Genetta Kenning is Medical Director for Pulmonary Rehab at [facility].

## 2024-04-27 NOTE — Addendum Note (Signed)
 Encounter addended by: Edy Belt, RN on: 04/27/2024 9:06 AM  Actions taken: Clinical Note Signed

## 2024-04-28 ENCOUNTER — Encounter (HOSPITAL_COMMUNITY)
Admission: RE | Admit: 2024-04-28 | Discharge: 2024-04-28 | Disposition: A | Discharge status: Home / Self Care | Source: Ambulatory Visit | Attending: Pulmonary Disease | Admitting: Pulmonary Disease

## 2024-04-28 ENCOUNTER — Telehealth (HOSPITAL_COMMUNITY): Payer: Self-pay

## 2024-04-28 DIAGNOSIS — J449 Chronic obstructive pulmonary disease, unspecified: Secondary | ICD-10-CM

## 2024-04-28 NOTE — Telephone Encounter (Signed)
 Patient called stating they are having some health issues and wants to cancel their classes until further notice. States he hopes once his issues are resolved he will get another referral and come back to pulmonary rehab in the future.

## 2024-04-28 NOTE — Addendum Note (Signed)
 Encounter addended by: Erskine Heart on: 04/28/2024 4:02 PM  Actions taken: Flowsheet accepted

## 2024-05-03 ENCOUNTER — Encounter (HOSPITAL_COMMUNITY)

## 2024-05-05 ENCOUNTER — Encounter (HOSPITAL_COMMUNITY)

## 2024-05-10 ENCOUNTER — Encounter (HOSPITAL_COMMUNITY)

## 2024-05-12 ENCOUNTER — Encounter (HOSPITAL_COMMUNITY)

## 2024-05-17 ENCOUNTER — Encounter (HOSPITAL_COMMUNITY)

## 2024-05-19 ENCOUNTER — Encounter (HOSPITAL_COMMUNITY)

## 2024-05-24 ENCOUNTER — Encounter (HOSPITAL_COMMUNITY)

## 2024-05-26 ENCOUNTER — Encounter (HOSPITAL_COMMUNITY)

## 2024-05-31 ENCOUNTER — Encounter (HOSPITAL_COMMUNITY)

## 2024-06-02 ENCOUNTER — Encounter (HOSPITAL_COMMUNITY)

## 2024-06-07 ENCOUNTER — Encounter (HOSPITAL_COMMUNITY)

## 2024-09-01 ENCOUNTER — Ambulatory Visit: Admitting: Endocrinology

## 2024-10-24 ENCOUNTER — Other Ambulatory Visit: Payer: Self-pay

## 2024-10-24 ENCOUNTER — Emergency Department (HOSPITAL_COMMUNITY)
Admission: EM | Admit: 2024-10-24 | Discharge: 2024-10-24 | Disposition: A | Discharge status: Home / Self Care | Attending: Emergency Medicine | Admitting: Emergency Medicine

## 2024-10-24 ENCOUNTER — Emergency Department (HOSPITAL_COMMUNITY)

## 2024-10-24 DIAGNOSIS — I10 Essential (primary) hypertension: Secondary | ICD-10-CM | POA: Insufficient documentation

## 2024-10-24 DIAGNOSIS — J341 Cyst and mucocele of nose and nasal sinus: Secondary | ICD-10-CM | POA: Diagnosis not present

## 2024-10-24 DIAGNOSIS — J0130 Acute sphenoidal sinusitis, unspecified: Secondary | ICD-10-CM | POA: Insufficient documentation

## 2024-10-24 DIAGNOSIS — Z7951 Long term (current) use of inhaled steroids: Secondary | ICD-10-CM | POA: Diagnosis not present

## 2024-10-24 DIAGNOSIS — Z79899 Other long term (current) drug therapy: Secondary | ICD-10-CM | POA: Insufficient documentation

## 2024-10-24 DIAGNOSIS — R519 Headache, unspecified: Secondary | ICD-10-CM | POA: Diagnosis present

## 2024-10-24 DIAGNOSIS — E119 Type 2 diabetes mellitus without complications: Secondary | ICD-10-CM | POA: Diagnosis not present

## 2024-10-24 DIAGNOSIS — Z7982 Long term (current) use of aspirin: Secondary | ICD-10-CM | POA: Insufficient documentation

## 2024-10-24 DIAGNOSIS — H5710 Ocular pain, unspecified eye: Secondary | ICD-10-CM | POA: Diagnosis not present

## 2024-10-24 DIAGNOSIS — Z7984 Long term (current) use of oral hypoglycemic drugs: Secondary | ICD-10-CM | POA: Diagnosis not present

## 2024-10-24 DIAGNOSIS — J45909 Unspecified asthma, uncomplicated: Secondary | ICD-10-CM | POA: Diagnosis not present

## 2024-10-24 LAB — CBG MONITORING, ED: Glucose-Capillary: 118 mg/dL — ABNORMAL HIGH (ref 70–99)

## 2024-10-24 MED ORDER — AMOXICILLIN-POT CLAVULANATE 875-125 MG PO TABS
1.0000 | ORAL_TABLET | Freq: Once | ORAL | Status: AC
  Administered 2024-10-24: 1 via ORAL
  Filled 2024-10-24: qty 1

## 2024-10-24 MED ORDER — AMOXICILLIN-POT CLAVULANATE 875-125 MG PO TABS
1.0000 | ORAL_TABLET | Freq: Two times a day (BID) | ORAL | 0 refills | Status: AC
Start: 2024-10-24 — End: 2024-10-31

## 2024-10-24 NOTE — ED Triage Notes (Signed)
 Pt c/o sinus pain in R eye, above the eye and beolw the eye, per pt. X lastnight also endorses chills and nausea. Denies vomiting, denies shob, denies diarrhea. Denies pain but states its just uncomfortable

## 2024-10-24 NOTE — Discharge Instructions (Signed)
 Your symptoms combined with your CT imaging suggest you may have a sinus infection and are being treated with antibiotics.  Your CT however also suggest you may have a cyst in your ethmoid sinus, this is probably not a new finding but could be the source of your chronic sinus concerns.  You may consider further evaluation by ear nose and throat specialist if you continue to have problems.  I do recommend follow-up with your primary doctor in a week if today's symptoms are not significantly better.  I encourage you to continue taking Tylenol  or Motrin for symptom relief.

## 2024-10-24 NOTE — ED Provider Notes (Signed)
 [facility] EMERGENCY DEPARTMENT AT [facility] Provider Note   CSN: [id number] Arrival date & time: 10/24/24  9261     Patient presents with: Facial Pain   Peter Lewis is a 68 y.o. male with a history including type 2 diabetes, hyperlipidemia, asthma, GERD, hypertension and allergy, has recently been put on fluticasone by his allergist which he takes daily presenting with right facial pain and pressure which he woke with today.  He describes pressure around his right eye including his cheek which radiates into his right forehead, intermittent, aching in character.  He denies fevers but did have a chill after he got out of the bath this morning.  He also had nausea prior to arrival which has improved.  He took Tylenol  around 7 AM this morning and has had some improvement in his facial pain symptoms.  He cannot describe nasal secretions, states he does not look.  He does have a history of sinus infections.  No visual disturbance.  Denies ear pain.   The history is provided by the patient and the spouse.       Prior to Admission medications   Medication Sig Start Date End Date Taking? Authorizing Provider  amoxicillin-clavulanate (AUGMENTIN) 875-125 MG tablet Take 1 tablet by mouth every 12 (twelve) hours for 7 days. 10/24/24 10/31/24 Yes Yanuel Tagg, Mliss, PA-C  acetaminophen  (TYLENOL ) 650 MG CR tablet Take 1,300 mg by mouth every 8 (eight) hours as needed for pain.    [provider]  albuterol  (PROAIR  HFA) 108 (90 Base) MCG/ACT inhaler 2 puffs every 4 hours as needed only  if your can't catch your breath 01/18/24   Darlean Ozell NOVAK, MD  aspirin EC 81 MG tablet Take 81 mg by mouth daily.    [provider]  atorvastatin (LIPITOR) 20 MG tablet Take by mouth. 10/10/22   [provider]  budesonide -formoterol  (SYMBICORT ) 80-4.5 MCG/ACT inhaler Take 2 puffs first thing in am and then another 2 puffs about 12 hours later. 01/18/24   Darlean Ozell NOVAK, MD   Carboxymethylcellulose Sod PF 0.5 % SOLN Apply to eye. 01/28/24   [provider]  cetirizine (ZYRTEC) 10 MG tablet Take 10 mg by mouth daily. 01/28/24   [provider]  empagliflozin  (JARDIANCE ) 25 MG TABS tablet Take 1 tablet (25 mg total) by mouth daily. 01/18/24   Darlean Ozell NOVAK, MD  fluticasone (FLONASE) 50 MCG/ACT nasal spray Place into the nose. 01/28/24   [provider]  ibuprofen (ADVIL,MOTRIN) 200 MG tablet Take 600 mg by mouth every 6 (six) hours as needed for moderate pain.    [provider]  ketotifen (ZADITOR) 0.035 % ophthalmic solution Apply to eye. 01/28/24   [provider]  losartan (COZAAR) 100 MG tablet Take 0.5 tablets by mouth daily. 06/11/23   [provider]  metFORMIN (GLUCOPHAGE) 500 MG tablet Take 500 mg by mouth 2 (two) times daily with a meal.    [provider]  montelukast (SINGULAIR) 10 MG tablet Take 10 mg by mouth. 12/18/23   [provider]  omeprazole (PRILOSEC) 20 MG capsule Take by mouth. 04/06/07   [provider]  sertraline (ZOLOFT) 100 MG tablet Take by mouth. 01/12/23   [provider]  tadalafil (CIALIS) 20 MG tablet Take by mouth. 10/10/22   [provider]  traZODone (DESYREL) 50 MG tablet Take by mouth. 01/12/23   [provider]  TRELEGY ELLIPTA 100-62.5-25 MCG/ACT AEPB Inhale 1 puff into the lungs daily.  01/19/24   [provider]    Allergies: Sildenafil    Review of Systems  Constitutional:  Positive for chills. Negative for fever.  HENT:  Positive for sinus pressure. Negative for congestion and sore throat.   Eyes: Negative.   Respiratory:  Negative for chest tightness and shortness of breath.   Cardiovascular:  Negative for chest pain.  Gastrointestinal:  Positive for nausea. Negative for abdominal pain.  Genitourinary: Negative.   Musculoskeletal:  Negative for arthralgias, joint swelling and neck pain.  Skin: Negative.   Negative for rash and wound.  Neurological:  Negative for dizziness, weakness, light-headedness, numbness and headaches.  Psychiatric/Behavioral: Negative.      Updated Vital Signs BP (!) 153/76 (BP Location: Right Arm)   Pulse 64   Temp 97.8 F (36.6 C) (Oral)   Resp 20   SpO2 96%   Physical Exam Vitals and nursing note reviewed.  Constitutional:      Appearance: He is well-developed.  HENT:     Head: Normocephalic and atraumatic.     Right Ear: Tympanic membrane and ear canal normal.     Nose:     Right Nostril: No occlusion.     Left Nostril: No occlusion.     Right Sinus: Maxillary sinus tenderness and frontal sinus tenderness present.     Mouth/Throat:     Mouth: Mucous membranes are moist.     Pharynx: Oropharynx is clear.  Eyes:     Extraocular Movements: Extraocular movements intact.     Conjunctiva/sclera: Conjunctivae normal.     Pupils: Pupils are equal, round, and reactive to light.     Comments: No pain with EOMs.  No PERI orbital erythema or edema.  Visual Acuity Bilateral Distance: 20/40 R Distance: 20/40 L Distance: 20/40    Cardiovascular:     Rate and Rhythm: Normal rate and regular rhythm.     Heart sounds: Normal heart sounds.  Pulmonary:     Effort: Pulmonary effort is normal.     Breath sounds: Normal breath sounds. No wheezing.  Musculoskeletal:        General: Normal range of motion.     Cervical back: Normal range of motion.  Skin:    General: Skin is warm and dry.  Neurological:     Mental Status: He is alert.     (all labs ordered are listed, but only abnormal results are displayed) Labs Reviewed  CBG MONITORING, ED - Abnormal; Notable for the following components:      Result Value   Glucose-Capillary 118 (*)    All other components within normal limits    EKG: None  Radiology: CT Maxillofacial Wo Contrast Result Date: 10/24/2024 EXAM: CT OF THE FACE WITHOUT CONTRAST 10/24/2024 09:53:39 AM TECHNIQUE: CT of the face was  performed without the administration of intravenous contrast. Multiplanar reformatted images are provided for review. Automated exposure control, iterative reconstruction, and/or weight based adjustment of the mA/kV was utilized to reduce the radiation dose to as low as reasonably achievable. COMPARISON: None available. CLINICAL HISTORY: 68 year old male with periorbital pain. FINDINGS: FACIAL BONES: Scattered carious dentition, mostly on the left. Visible tympanic cavities and mastoids are clear. No acute facial fracture. No mandibular dislocation. No suspicious bone lesion. ORBITS: Globes are intact. Intact orbit walls. Orbit soft tissues appear symmetric and normal. No acute traumatic injury. No inflammatory change. SINUSES AND MASTOIDS: Visible tympanic cavities and mastoids are clear. Bilateral paranasal sinuses are generally well aerated. There is mild frontal and  ethmoid sinus mucosal thickening bilaterally. Trace mucosal thickening in the maxillary sinuses. Moderate mucosal thickening or mucous retention cyst in the lateral right sphenoid sinus. No layering sinus fluid. SOFT TISSUES: Negative visible non-contrast pharynx, parapharyngeal spaces, retropharyngeal space, sublingual space, submandibular spaces, masticator and parotid spaces. Incidental partially retropharyngeal course of the carotids. Calcified atherosclerosis at the skull base. Visible non-contrast parenchyma remarkable for probable dilated perivascular space at the inferior left uniform (normal variant). No upper cervical lymphadenopathy. IMPRESSION: 1. No acute or inflammatory process identified in the non-contrast Face. 2. Mild bilateral paranasal sinuses no complicating features. Electronically signed by: Helayne Hurst MD 10/24/2024 11:05 AM EST RP Workstation: [id number]     Procedures   Medications Ordered in the ED  amoxicillin-clavulanate (AUGMENTIN) 875-125 MG per tablet 1 tablet (1 tablet Oral Given 10/24/24 1153)                                     Medical Decision Making Patient presenting with right sinus pain radiating from his cheek to his forehead including his right eye had chills this morning, has had nasal congestion which he is chronically on Flonase.  Concern for sinus infection.  He has had nasal secretions but he cannot describe its character.  History and exam suggest acute sinusitis, however given eye involvement and diabetic history, imaging was obtained to rule out orbital involvement.  CT is negative, he does have a retention cyst in his right sphenoid which is probably chronic finding and might explain his chronic sinus symptoms.  He has not seen an ENT specialist but is under the care of an allergist.  Clinically patient will be treated for acute sinusitis, he was started on Augmentin, he was encouraged to continue his Flonase.  He was advised close follow-up with his PCP for recheck if symptoms are not significantly improving or for any worsening symptoms over the next week.  Amount and/or Complexity of Data Reviewed Radiology: ordered.    Details: CT reviewed, right sphenoid cyst, bilateral frontal and ethmoid sinus mucosal thickening, probably chronic.  Orbital soft tissues are normal.  Risk Prescription drug management.        Final diagnoses:  Acute non-recurrent sphenoidal sinusitis  Sphenoid cyst, right    ED Discharge Orders          Ordered    amoxicillin-clavulanate (AUGMENTIN) 875-125 MG tablet  Every 12 hours        10/24/24 1135               Brenlynn Fake, PA-C 10/24/24 1656    Suzette Pac, MD 10/25/24 [phone]

## 2024-11-29 ENCOUNTER — Other Ambulatory Visit

## 2024-11-29 ENCOUNTER — Encounter: Payer: Self-pay | Admitting: Endocrinology

## 2024-11-29 ENCOUNTER — Ambulatory Visit: Admitting: Endocrinology

## 2024-11-29 VITALS — BP 150/78 | HR 93 | Ht 72.0 in | Wt 254.0 lb

## 2024-11-29 DIAGNOSIS — E042 Nontoxic multinodular goiter: Secondary | ICD-10-CM

## 2024-11-29 NOTE — Progress Notes (Signed)
 Outpatient Endocrinology Note Ausha Sieh, MD   Patient's Name: Peter Lewis    DOB: Jun 24, 1956    MRN: [id number]  REASON OF VISIT: New consult for thyroid  nodules   PCP: Shona Norleen PEDLAR, MD  REFERRING PROVIDER: Eber Dorothe HERO, MD    HISTORY OF PRESENT ILLNESS:   Peter Lewis is a 68 y.o. old male with past medical history as listed below is presented for a  new consult for thyroid  nodules.  Pertinent Thyroid  History:  Patient is referred to endocrinology for evaluation and management of multiple thyroid  nodules, initial consult on November 29, 2024.  Patient was incidentally found to have right thyroid  nodules on CT chest, subsequently had ultrasound thyroid  on Apr 26, 2024 at [facility] [place] [facility].  Records reviewed scanned into media.  Ultrasound thyroid  findings completed on Apr 26, 2024: Reviewed report, no images available. -Right lobe measures 6.3 x 3.0 x 2.5 cm and left lobe measures 4.8 x 2.6 x 2.1 cm.  The thyroid  isthmus measuring 8.1 mm, thyroid  gland is heterogeneous.  Right thyroid  lobe there is a hypoechoic nodule with lobulated margins in the upper right measuring 1.0 x 1.1 x 1.4 cm which contains punctate echogenic foci.  Solid heterogeneous nodule with ill-defined margin in the mid right thyroid  lobe measures 1.7 x 1.4 x 1.1 cm.  Left thyroid  lobe: There is a hypoechoic nodule with ill-defined margin in the mid left thyroid  lobe measuring 1.0 x 0.7 x 0.7 cm.  Impression: Heterogeneous thyroid  gland with multiple nodules.  2 right thyroid  nodules meet criteria for biopsy.  - Patient is euthyroid.  Not on thyroid  medication.  He denies palpitation or heat intolerance.  Energy level is fair.  No constipation.  No change in bowel habit.  -He denies neck discomfort or neck compressive symptoms.  -No radiation exposure to head and neck.  -Denies family history of thyroid  cancer.  He has sister with thyroid  disorder with thyroid  nodules.  # He has type 2  diabetes mellitus, managed by primary care provider.  Interval history  Initial visit today for evaluation and management of thyroid  nodules.  Ultrasound completed in May 2025.  He has not had FNA of thyroid  nodule.  REVIEW OF SYSTEMS:  As per history of present illness.   PAST MEDICAL HISTORY: Past Medical History:  Diagnosis Date   Arthritis    Asthma    Diabetes mellitus without complication (HCC)    GERD (gastroesophageal reflux disease)    Hyperlipidemia    Hypertension    Sleep apnea     PAST SURGICAL HISTORY: Past Surgical History:  Procedure Laterality Date   HERNIA REPAIR     Umbilical as a teenager   INGUINAL HERNIA REPAIR Right 05/14/2018   Procedure: RECURRENT HERNIA REPAIR INGUINAL ADULT WITH MESH;  Surgeon: Mavis Anes, MD;  Location: [facility];  Service: General;  Laterality: Right;   WRIST FRACTURE SURGERY Right     ALLERGIES: Allergies[1]  FAMILY HISTORY:  History reviewed. No pertinent family history.  SOCIAL HISTORY: Social History   Socioeconomic History   Marital status: Married    Spouse name: Not on file   Number of children: 6   Years of education: Not on file   Highest education level: 12th grade  Occupational History   Not on file  Tobacco Use   Smoking status: Never   Smokeless tobacco: Never  Vaping Use   Vaping status: Never Used  Substance and Sexual Activity   Alcohol use: Yes  Comment: weekends   Drug use: No   Sexual activity: Not on file  Other Topics Concern   Not on file  Social History Narrative   Lives with wife   Social Drivers of Health   Tobacco Use: Low Risk (11/29/2024)   Patient History    Smoking Tobacco Use: Never    Smokeless Tobacco Use: Never    Passive Exposure: Not on file  Financial Resource Strain: Not on file  Food Insecurity: Not on file  Transportation Needs: Not on file  Physical Activity: Not on file  Stress: Not on file  Social Connections: Not on file  Depression (PHQ2-9): Low Risk  (03/11/2024)   Depression (PHQ2-9)    PHQ-2 Score: 0  Alcohol Screen: Not on file  Housing: Not on file  Utilities: Not on file  Health Literacy: Not on file    MEDICATIONS:  Current Outpatient Medications  Medication Sig Dispense Refill   acetaminophen  (TYLENOL ) 650 MG CR tablet Take 1,300 mg by mouth every 8 (eight) hours as needed for pain.     albuterol  (PROAIR  HFA) 108 (90 Base) MCG/ACT inhaler 2 puffs every 4 hours as needed only  if your can't catch your breath 18 g 11   aspirin EC 81 MG tablet Take 81 mg by mouth daily.     atorvastatin (LIPITOR) 20 MG tablet Take by mouth.     budesonide -formoterol  (SYMBICORT ) 80-4.5 MCG/ACT inhaler Take 2 puffs first thing in am and then another 2 puffs about 12 hours later. 1 each 12   Carboxymethylcellulose Sod PF 0.5 % SOLN Apply to eye.     cetirizine (ZYRTEC) 10 MG tablet Take 10 mg by mouth daily.     empagliflozin  (JARDIANCE ) 25 MG TABS tablet Take 1 tablet (25 mg total) by mouth daily.     fluticasone (FLONASE) 50 MCG/ACT nasal spray Place into the nose.     ibuprofen (ADVIL,MOTRIN) 200 MG tablet Take 600 mg by mouth every 6 (six) hours as needed for moderate pain.     ketotifen (ZADITOR) 0.035 % ophthalmic solution Apply to eye.     losartan (COZAAR) 100 MG tablet Take 0.5 tablets by mouth daily.     metFORMIN (GLUCOPHAGE) 500 MG tablet Take 500 mg by mouth 2 (two) times daily with a meal.     montelukast (SINGULAIR) 10 MG tablet Take 10 mg by mouth.     omeprazole (PRILOSEC) 20 MG capsule Take by mouth.     sertraline (ZOLOFT) 100 MG tablet Take by mouth.     tadalafil (CIALIS) 20 MG tablet Take by mouth.     traZODone (DESYREL) 50 MG tablet Take by mouth.     TRELEGY ELLIPTA 100-62.5-25 MCG/ACT AEPB Inhale 1 puff into the lungs daily.     No current facility-administered medications for this visit.    PHYSICAL EXAM: Vitals:   11/29/24 1432  BP: (!) 150/78  Pulse: 93  SpO2: 97%  Weight: 254 lb (115.2 kg)  Height: 6' (1.829  m)   Body mass index is 34.45 kg/m.  Wt Readings from Last 3 Encounters:  11/29/24 254 lb (115.2 kg)  04/05/24 256 lb 6.3 oz (116.3 kg)  03/29/24 256 lb 2.8 oz (116.2 kg)     General: Well developed, well nourished male in no apparent distress.  HEENT: AT/[place], no external lesions. Hearing intact to the spoken word Eyes: EOMI. No stare, proptosis. Conjunctiva clear and no icterus. Neck: Trachea midline, neck supple without appreciable thyromegaly or lymphadenopathy and small  right palpable thyroid  nodule + Lungs: Clear to auscultation, no wheeze. Respirations not labored Heart: S1S2, Regular in rate and rhythm.  Abdomen: Soft, non tender, non distended Neurologic: Alert, oriented, normal speech, deep tendon biceps reflexes normal,  no gross focal neurological deficit Extremities: No pedal pitting edema, no tremors of outstretched hands Skin: Warm, color good.  Psychiatric: Does not appear depressed or anxious  PERTINENT HISTORIC LABORATORY AND IMAGING STUDIES:  All pertinent laboratory results were reviewed. Please see HPI also for further details.   No results found for: TSH, T3TOTAL   ASSESSMENT / PLAN  1. Multiple thyroid  nodules    Patient was incidentally found to have thyroid  nodule on CT chest, ultrasound thyroid  completed in May 2025, at [facility] clinic, showed right upper thyroid  nodule measuring 1.4 cm with punctate echogenic foci and right mid thyroid  nodule measuring 1.7 cm and left thyroid  nodule measuring 1.0 cm.  No images available to review.  Patient thinks he has CD of ultrasound images at home.  - No compressive symptoms, euthyroid, no family h/o thyroid  cancer, no h/o radiation exposure.  Plan: - I would like to repeat ultrasound thyroid  this time, first ultrasound was completed in May 2025. - Patient is asked to bring ultrasound thyroid  CD, radiology which can be uploaded in our system and would be helpful to compare. -Check thyroid  function test today. - Will  determine if there is need of FNA/biopsy of thyroid  nodule based on the finding on repeat ultrasound thyroid .  Thyroid  nodule / FNA talk: -Approximately 5% of individuals have palpable thyroid  nodules and 30% or more of adults have non-palpable nodules. The prevalence of nodules increases with age, so that perhaps 50% of individuals older than 70 years of age have nodules. Many of these nodules will be well under 1cm in size.  -The incidence of thyroid  cancer is low, but rising. The prevalence of thyroid  cancer is low compared with the prevalence of thyroid  nodule.  -There have been a number of studies that have estimated risk of malignancy in thyroid  nodule. The estimated risk varies with size and other characteristics of nodules selected for biopsy, the technique used for the biopsy, the institution and its referral patterns, and the characteristics of the local population. Most studies have estimated malignancy risk in nodules that are palpable or > 1cm on U/S to be in the range of 3-15%.    - In general, options for further evaluation include: - Follow with serial U/S - Fine needle aspiration biopsy - Thyroidectomy - In general the criteria for FNA biopsy includes:  - All palpable nodules - Generally nodules > 1 and for some nodules up to > 1 to 2 cm, based on other criteria.  - Rarely Nodules > 8-53mm in two or more dimensions with high risk sonographic features, or occuring in patients with high risk historical features. Nodules not meeting the above criteria can generally be followed with serial ultrasounds.  -Thyroid  FNA is required for further evaluation of nodule to see if suspicious for cancer which may require further surgical treatment. -There are false positive and false negative results with FNA and need for repeat FNA or surgical resection in some cases and also possibility of missing a diagnosis of Cancer in 5-15% of cases. -discussed the risk and benefits of procedure and potential  complications such as bleeding, pain in neck, swelling in neck in rare cases due to hematoma formation and causing respiratory compromise, possibility of infection etc. -discussed the need for continued follow up  even if the nodule were found to be benign on FNA.  -discussed that the only near 100% method of ruling out thyroid  cancer is by surgical resection. -patient agreeable for Thyroid  FNA if needed.  -Will decide afterward repeating ultrasound thyroid .   Khizar was seen today for establish care.  Diagnoses and all orders for this visit:  Multiple thyroid  nodules -     T4, free -     TSH -     US  THYROID ; Future   Normal thyroid  function test.   Latest Reference Range & Units 11/29/24 15:27  TSH 0.40 - 4.50 mIU/L 0.68  T4,Free(Direct) 0.8 - 1.8 ng/dL 1.1    DISPOSITION Follow up in clinic in 3 months suggested.  All questions answered and patient verbalized understanding of the plan.  Marco Adelson, MD [facility] Endocrinology [facility] [address] [place], [address] [place], [place] [address] Phone # [phone]  At least part of this note was generated using voice recognition software. Inadvertent word errors may have occurred, which were not recognized during the proofreading process.     [1]  Allergies Allergen Reactions   Sildenafil Other (See Comments)    Other Reaction(s): Headache

## 2024-11-30 ENCOUNTER — Ambulatory Visit: Payer: Self-pay | Admitting: Endocrinology

## 2024-11-30 LAB — TSH: TSH: 0.68 m[IU]/L (ref 0.40–4.50)

## 2024-11-30 LAB — T4, FREE: Free T4: 1.1 ng/dL (ref 0.8–1.8)

## 2024-12-22 ENCOUNTER — Other Ambulatory Visit

## 2024-12-29 ENCOUNTER — Encounter: Payer: Self-pay | Admitting: *Deleted

## 2025-02-01 ENCOUNTER — Other Ambulatory Visit

## 2025-02-02 ENCOUNTER — Ambulatory Visit: Admitting: General Surgery

## 2025-02-27 ENCOUNTER — Ambulatory Visit: Admitting: Endocrinology
